# Patient Record
Sex: Male | Born: 1955
Health system: Southern US, Community
[De-identification: ages and names within clinical notes are randomized; demographics above are authoritative.]

## PROBLEM LIST (undated history)

## (undated) DIAGNOSIS — I10 Essential (primary) hypertension: Secondary | ICD-10-CM

## (undated) DIAGNOSIS — Z8619 Personal history of other infectious and parasitic diseases: Secondary | ICD-10-CM

## (undated) DIAGNOSIS — M1990 Unspecified osteoarthritis, unspecified site: Secondary | ICD-10-CM

## (undated) HISTORY — DX: Personal history of other infectious and parasitic diseases: Z86.19

## (undated) HISTORY — DX: Essential (primary) hypertension: I10

---

## 2009-09-18 ENCOUNTER — Ambulatory Visit: Payer: Self-pay | Admitting: Gastroenterology

## 2009-09-21 HISTORY — PX: COLONOSCOPY: SHX174

## 2010-02-01 ENCOUNTER — Emergency Department (HOSPITAL_COMMUNITY): Admission: EM | Admit: 2010-02-01 | Discharge: 2010-02-01 | Payer: Self-pay | Admitting: Family Medicine

## 2013-01-04 ENCOUNTER — Encounter: Payer: Self-pay | Admitting: Family Medicine

## 2013-01-04 ENCOUNTER — Ambulatory Visit (INDEPENDENT_AMBULATORY_CARE_PROVIDER_SITE_OTHER): Payer: BC Managed Care – PPO | Admitting: Family Medicine

## 2013-01-04 VITALS — BP 162/102 | HR 72 | Temp 98.5°F | Ht 71.0 in | Wt 190.5 lb

## 2013-01-04 DIAGNOSIS — M25552 Pain in left hip: Secondary | ICD-10-CM

## 2013-01-04 DIAGNOSIS — M25559 Pain in unspecified hip: Secondary | ICD-10-CM

## 2013-01-04 DIAGNOSIS — M25651 Stiffness of right hip, not elsewhere classified: Secondary | ICD-10-CM | POA: Insufficient documentation

## 2013-01-04 DIAGNOSIS — I10 Essential (primary) hypertension: Secondary | ICD-10-CM

## 2013-01-04 DIAGNOSIS — I1A0 Resistant hypertension: Secondary | ICD-10-CM | POA: Insufficient documentation

## 2013-01-04 DIAGNOSIS — M160 Bilateral primary osteoarthritis of hip: Secondary | ICD-10-CM | POA: Insufficient documentation

## 2013-01-04 MED ORDER — METOPROLOL TARTRATE 25 MG PO TABS: 25.0000 mg | ORAL_TABLET | Freq: Two times a day (BID) | ORAL | Status: DC

## 2013-01-04 NOTE — Progress Notes (Signed)
Subjective:    Patient ID: Roy Galvan, male    DOB: 1956-07-12, 57 y.o.   MRN: [id number]  HPI CC: new pt to establish  Prior saw Dr. Nehemiah Settle.  More convenient here.  HTN - hereditary.  Has been on bp meds for 4 years.  Ale to check bp at home, doesn't really check however.    L hip pain present for years.  Points to lateral hip.  Constantly aches.  On his feel all day.  Has tried aleve prn.  Lives with wife, 2 sons and 1 daughter, dogs and cats (rescue) Occupation: owns [employer] in [place] Edu: HS Activity: no regular exercise 2/2 hip pain Diet: good water, fruits/vegetables some  Preventative: Tetanus 2011 Pneumovax 2012 Colonoscopy 2011 by Dr. Bluford Kaufmann.  Per pt normal, rec rpt 10 yrs.  Medications and allergies reviewed and updated in chart.  Past histories reviewed and updated if relevant as below. There is no problem list on file for this patient.  Past Medical History  Diagnosis Date  . HTN (hypertension)   . History of chicken pox    Past Surgical History  Procedure Laterality Date  . Colonoscopy  2011    normal per pt, rec rpt 10 yrs (Dr. Bluford Kaufmann)   History  Substance Use Topics  . Smoking status: Never Smoker   . Smokeless tobacco: Never Used  . Alcohol Use: No     Comment: no drink since 1995   Family History  Problem Relation Age of Onset  . Hypertension Father   . Hypertension Mother   . Cancer Father     prostate  . Stroke Father     mini  . CAD Neg Hx   . Diabetes Neg Hx    No Known Allergies No current outpatient prescriptions on file prior to visit.   No current facility-administered medications on file prior to visit.     Review of Systems  Constitutional: Negative for fever, chills, activity change, appetite change, fatigue and unexpected weight change.  HENT: Positive for congestion (seasonal). Negative for hearing loss and neck pain.   Eyes: Negative for visual disturbance.  Respiratory: Negative for cough, chest tightness,  shortness of breath and wheezing.   Cardiovascular: Negative for chest pain, palpitations and leg swelling.  Skin: Negative for rash.  Neurological: Negative for dizziness, seizures, syncope and headaches.  Psychiatric/Behavioral: Negative for dysphoric mood. The patient is not nervous/anxious.        Objective:   Physical Exam  Nursing note and vitals reviewed. Constitutional: He is oriented to person, place, and time. He appears well-developed and well-nourished. No distress.  HENT:  Head: Normocephalic and atraumatic.  Nose: Nose normal.  Mouth/Throat: Oropharynx is clear and moist. No oropharyngeal exudate.  Eyes: Conjunctivae and EOM are normal. Pupils are equal, round, and reactive to light. No scleral icterus.  Neck: Normal range of motion. Neck supple. No thyromegaly present.  Cardiovascular: Normal rate, regular rhythm, normal heart sounds and intact distal pulses.   No murmur heard. Pulses:      Radial pulses are 2+ on the right side, and 2+ on the left side.  Pulmonary/Chest: Effort normal and breath sounds normal. No respiratory distress. He has no wheezes. He has no rales.  Abdominal: Soft. Bowel sounds are normal. He exhibits no distension and no mass. There is no tenderness. There is no rebound and no guarding.  Musculoskeletal: Normal range of motion. He exhibits no edema.  No pain with int/ext rotation at  hip bilaterally. Neg FABER test. No pain at GTB bilaterally  Lymphadenopathy:    He has no cervical adenopathy.  Neurological: He is alert and oriented to person, place, and time.  CN grossly intact, station and gait intact  Skin: Skin is warm and dry. No rash noted.  Psychiatric: He has a normal mood and affect. His behavior is normal. Judgment and thought content normal.       Assessment & Plan:

## 2013-01-04 NOTE — Assessment & Plan Note (Signed)
Chronic, uncontrolled. Compliant with 3 antihypertensives. Start 4th today - metoprolol 25mg  bid. recheck at physical. Requested records from prior PCP today.

## 2013-01-04 NOTE — Assessment & Plan Note (Signed)
Unclear etiology. Not consistent with GTBursitis or arthritis. Recommended start stretching exercises, if not better, consider imaging to eval arthritis. Long h/o hockey, softball and other sports.

## 2013-01-04 NOTE — Patient Instructions (Addendum)
Work on diet - less sodium (salt), more water and more fruits/vegetables (potassium [place] foods) to help blood pressure.  Look at [facility] diet below. Goal blood pressure is <140/90.  As your blood pressure is staying elevated, I recommend starting 4th blood pressure medicine, metoprolol 25mg  twice daily.  We will recheck when you return for physical. For hip - start with stretching legs daily.  Exercises provided.  Let me know how this is doing. Return at your convenience fasting for blood work and afterwards for physical.  DASH Diet The DASH diet stands for "Dietary Approaches to Stop Hypertension." It is a healthy eating plan that has been shown to reduce high blood pressure (hypertension) in as little as 14 days, while also possibly providing other significant health benefits. These other health benefits include reducing the risk of breast cancer after menopause and reducing the risk of type 2 diabetes, heart disease, colon cancer, and stroke. Health benefits also include weight loss and slowing kidney failure in patients with chronic kidney disease.  DIET GUIDELINES  Limit salt (sodium). Your diet should contain less than 1500 mg of sodium daily.  Limit refined or processed carbohydrates. Your diet should include mostly whole grains. Desserts and added sugars should be used sparingly.  Include small amounts of heart-healthy fats. These types of fats include nuts, oils, and tub margarine. Limit saturated and trans fats. These fats have been shown to be harmful in the body. CHOOSING FOODS  The following food groups are based on a 2000 calorie diet. See your Registered Dietitian for individual calorie needs. Grains and Grain Products (6 to 8 servings daily)  Eat More Often: Whole-wheat bread, brown rice, whole-grain or wheat pasta, quinoa, popcorn without added fat or salt (air popped).  Eat Less Often: White bread, white pasta, white rice, cornbread. Vegetables (4 to 5 servings daily)  Eat More  Often: Fresh, frozen, and canned vegetables. Vegetables may be raw, steamed, roasted, or grilled with a minimal amount of fat.  Eat Less Often/Avoid: Creamed or fried vegetables. Vegetables in a cheese sauce. Fruit (4 to 5 servings daily)  Eat More Often: All fresh, canned (in natural juice), or frozen fruits. Dried fruits without added sugar. One hundred percent fruit juice ( cup [237 mL] daily).  Eat Less Often: Dried fruits with added sugar. Canned fruit in light or heavy syrup. [employer], Fish, and Poultry (2 servings or less daily. One serving is 3 to 4 oz [85-114 g]).  Eat More Often: Ninety percent or leaner ground beef, tenderloin, sirloin. Round cuts of beef, chicken breast, [place] breast. All fish. Grill, bake, or broil your meat. Nothing should be fried.  Eat Less Often/Avoid: Fatty cuts of meat, [place], or chicken leg, thigh, or wing. Fried cuts of meat or fish. Dairy (2 to 3 servings)  Eat More Often: Low-fat or fat-free milk, low-fat plain or light yogurt, reduced-fat or part-skim cheese.  Eat Less Often/Avoid: Milk (whole, 2%).Whole milk yogurt. Full-fat cheeses. Nuts, Seeds, and Legumes (4 to 5 servings per week)  Eat More Often: All without added salt.  Eat Less Often/Avoid: Salted nuts and seeds, canned beans with added salt. Fats and Sweets (limited)  Eat More Often: Vegetable oils, tub margarines without trans fats, sugar-free gelatin. Mayonnaise and salad dressings.  Eat Less Often/Avoid: Coconut oils, palm oils, butter, stick margarine, cream, half and half, cookies, candy, pie. FOR MORE INFORMATION The Dash Diet Eating Plan: www.dashdiet.org Document Released: 08/27/2011 Document Revised: 11/30/2011 Document Reviewed: 08/27/2011 [employer] Patient Information 2013  [employer].

## 2013-01-26 ENCOUNTER — Other Ambulatory Visit: Payer: Self-pay | Admitting: Family Medicine

## 2013-01-26 DIAGNOSIS — Z125 Encounter for screening for malignant neoplasm of prostate: Secondary | ICD-10-CM

## 2013-01-26 DIAGNOSIS — I10 Essential (primary) hypertension: Secondary | ICD-10-CM

## 2013-01-30 ENCOUNTER — Other Ambulatory Visit (INDEPENDENT_AMBULATORY_CARE_PROVIDER_SITE_OTHER): Payer: BC Managed Care – PPO

## 2013-01-30 DIAGNOSIS — Z125 Encounter for screening for malignant neoplasm of prostate: Secondary | ICD-10-CM

## 2013-01-30 DIAGNOSIS — I10 Essential (primary) hypertension: Secondary | ICD-10-CM

## 2013-01-30 LAB — COMPREHENSIVE METABOLIC PANEL
Alkaline Phosphatase: 51 U/L (ref 39–117)
BUN: 15 mg/dL (ref 6–23)
Creatinine, Ser: 0.7 mg/dL (ref 0.4–1.5)
Glucose, Bld: 104 mg/dL — ABNORMAL HIGH (ref 70–99)
Sodium: 142 mEq/L (ref 135–145)
Total Bilirubin: 0.7 mg/dL (ref 0.3–1.2)

## 2013-01-30 LAB — LIPID PANEL
Cholesterol: 177 mg/dL (ref 0–200)
HDL: 32.4 mg/dL — ABNORMAL LOW (ref >39.00)
Triglycerides: 113 mg/dL (ref 0.0–149.0)
VLDL: 22.6 mg/dL (ref 0.0–40.0)

## 2013-02-06 ENCOUNTER — Ambulatory Visit (INDEPENDENT_AMBULATORY_CARE_PROVIDER_SITE_OTHER): Payer: BC Managed Care – PPO | Admitting: Family Medicine

## 2013-02-06 ENCOUNTER — Encounter: Payer: Self-pay | Admitting: Family Medicine

## 2013-02-06 VITALS — BP 150/90 | HR 60 | Temp 98.2°F | Ht 71.0 in | Wt 185.5 lb

## 2013-02-06 DIAGNOSIS — I10 Essential (primary) hypertension: Secondary | ICD-10-CM

## 2013-02-06 DIAGNOSIS — Z0000 Encounter for general adult medical examination without abnormal findings: Secondary | ICD-10-CM | POA: Insufficient documentation

## 2013-02-06 NOTE — Assessment & Plan Note (Signed)
Preventative protocols reviewed and updated unless pt declined. Discussed healthy diet and lifestyle.  

## 2013-02-06 NOTE — Patient Instructions (Signed)
Good to see you today, call us with questions. Increase aerobic exercise to raise good cholesterol levels. Sign release form today for records of colonoscopy by Dr. Bluford Kaufmann. Return in 6 months to recheck blood pressures.

## 2013-02-06 NOTE — Assessment & Plan Note (Signed)
Chronic, improved control. On 4 bp meds. Consider checking aldosterone levels next blood draw.

## 2013-02-06 NOTE — Progress Notes (Signed)
Subjective:    Patient ID: Roy Galvan, male    DOB: [birth year], 57 y.o.   MRN: [id number]  HPI CC: CPE  Hypertension - on 4 meds, still poor control of BP.  State at home running 130s/88-90.  Today still elevated but better.  Occasional dizziness.  No chest pain/tightness, HA, SOB, leg swelling.  Cutting down on salt.  Drinking good water.  Denies pheo sxs. BP Readings from Last 3 Encounters:  02/06/13 150/90  01/04/13 162/102    Preventative:  Colonoscopy 2011 by Dr. Bluford Kaufmann. Per pt normal, rec rpt 10 yrs. Prostate cancer screening - father with h/o prostate cancer. Tetanus 2011  Pneumovax 2012 ?unsure Flu shot - 2011  Seat belt use discussed Sunscreen use discussed - doesn't wear.  Denies concerning skin spots.  Lives with wife, 2 sons and 1 daughter, dogs and cats (rescue)  Occupation: owns [employer] in [place]  Edu: HS  Activity: no regular exercise 2/2 hip pain  Diet: good water, fruits/vegetables daily   Medications and allergies reviewed and updated in chart.  Past histories reviewed and updated if relevant as below. Patient Active Problem List   Diagnosis Date Noted  . Left hip pain 01/04/2013  . HTN (hypertension)    Past Medical History  Diagnosis Date  . HTN (hypertension)   . History of chicken pox    Past Surgical History  Procedure Laterality Date  . Colonoscopy  2011    normal per pt, rec rpt 10 yrs (Dr. Bluford Kaufmann)   History  Substance Use Topics  . Smoking status: Never Smoker   . Smokeless tobacco: Never Used  . Alcohol Use: No     Comment: no drink since 1995   Family History  Problem Relation Age of Onset  . Hypertension Father   . Hypertension Mother   . Cancer Father     prostate  . Stroke Father     mini  . CAD Neg Hx   . Diabetes Neg Hx    No Known Allergies Current Outpatient Prescriptions on File Prior to Visit  Medication Sig Dispense Refill  . amLODipine (NORVASC) 10 MG tablet Take 10 mg by mouth daily.      Marland Kitchen aspirin  81 MG tablet Take 81 mg by mouth daily.      . enalapril (VASOTEC) 20 MG tablet Take 20 mg by mouth 2 (two) times daily.      . hydrochlorothiazide (HYDRODIURIL) 25 MG tablet Take 25 mg by mouth daily.      . metoprolol tartrate (LOPRESSOR) 25 MG tablet Take 1 tablet (25 mg total) by mouth 2 (two) times daily.  60 tablet  11   No current facility-administered medications on file prior to visit.     Review of Systems  Constitutional: Negative for fever, chills, activity change, appetite change, fatigue and unexpected weight change.  HENT: Negative for hearing loss, congestion (seasonal) and neck pain.   Eyes: Negative for visual disturbance.  Respiratory: Negative for cough, chest tightness, shortness of breath and wheezing.   Cardiovascular: Negative for chest pain, palpitations and leg swelling.  Gastrointestinal: Negative for nausea, vomiting, abdominal pain, diarrhea, constipation, blood in stool and abdominal distention.  Genitourinary: Negative for hematuria and difficulty urinating.  Musculoskeletal: Negative for myalgias and arthralgias.  Skin: Negative for rash.  Neurological: Negative for dizziness, seizures, syncope and headaches.  Hematological: Negative for adenopathy. Does not bruise/bleed easily.  Psychiatric/Behavioral: Negative for dysphoric mood. The patient is not nervous/anxious.  Objective:   Physical Exam  Nursing note and vitals reviewed. Constitutional: He is oriented to person, place, and time. He appears well-developed and well-nourished. No distress.  HENT:  Head: Normocephalic and atraumatic.  Nose: Nose normal.  Mouth/Throat: Oropharynx is clear and moist. No oropharyngeal exudate.  Eyes: Conjunctivae and EOM are normal. Pupils are equal, round, and reactive to light. No scleral icterus.  Neck: Normal range of motion. Neck supple. Carotid bruit is not present. No thyromegaly present.  Cardiovascular: Normal rate, regular rhythm, normal heart sounds  and intact distal pulses.   No murmur heard. Pulses:      Radial pulses are 2+ on the right side, and 2+ on the left side.  Pulmonary/Chest: Effort normal and breath sounds normal. No respiratory distress. He has no wheezes. He has no rales.  Abdominal: Soft. Normal appearance and bowel sounds are normal. He exhibits no distension, no abdominal bruit and no mass. There is no tenderness. There is no rebound and no guarding.  No abd/renal bruit  Genitourinary: Rectum normal and prostate normal. Rectal exam shows no external hemorrhoid, no internal hemorrhoid, no fissure, no mass, no tenderness and anal tone normal. Prostate is not enlarged (15gm) and not tender.  Musculoskeletal: Normal range of motion. He exhibits no edema.  Lymphadenopathy:    He has no cervical adenopathy.  Neurological: He is alert and oriented to person, place, and time.  CN grossly intact, station and gait intact  Skin: Skin is warm and dry. No rash noted.  Psychiatric: He has a normal mood and affect. His behavior is normal. Judgment and thought content normal.       Assessment & Plan:

## 2013-02-13 ENCOUNTER — Encounter: Payer: Self-pay | Admitting: Family Medicine

## 2013-02-17 ENCOUNTER — Encounter: Payer: Self-pay | Admitting: Family Medicine

## 2013-07-31 ENCOUNTER — Telehealth: Payer: Self-pay

## 2013-07-31 NOTE — Telephone Encounter (Signed)
Pt request refills of meds but does not know the name of meds needed. Pt will cb with names of meds that need refill.

## 2013-08-03 MED ORDER — AMLODIPINE BESYLATE 10 MG PO TABS: 10.0000 mg | ORAL_TABLET | Freq: Every day | ORAL | Status: DC

## 2013-08-03 MED ORDER — ENALAPRIL MALEATE 20 MG PO TABS: 20.0000 mg | ORAL_TABLET | Freq: Two times a day (BID) | ORAL | Status: DC

## 2013-08-03 MED ORDER — HYDROCHLOROTHIAZIDE 25 MG PO TABS: 25.0000 mg | ORAL_TABLET | Freq: Every day | ORAL | Status: DC

## 2013-08-03 NOTE — Telephone Encounter (Signed)
Pt called back requesting refills amlodipine,enalapril and HCTZ to [facility] [address]. Verified dosage and instructions with pt.Advised will do one month; pt has 08/14/13 appt and will discuss BP med at that appt. Pt voiced understanding.

## 2013-08-03 NOTE — Addendum Note (Signed)
Addended by: Patience Musca on: 08/03/2013 10:37 AM   Modules accepted: Orders

## 2013-08-14 ENCOUNTER — Encounter: Payer: Self-pay | Admitting: Family Medicine

## 2013-08-14 ENCOUNTER — Ambulatory Visit (INDEPENDENT_AMBULATORY_CARE_PROVIDER_SITE_OTHER): Payer: BC Managed Care – PPO | Admitting: Family Medicine

## 2013-08-14 VITALS — BP 142/96 | HR 68 | Temp 98.2°F | Wt 186.5 lb

## 2013-08-14 DIAGNOSIS — Z23 Encounter for immunization: Secondary | ICD-10-CM

## 2013-08-14 DIAGNOSIS — I10 Essential (primary) hypertension: Secondary | ICD-10-CM

## 2013-08-14 LAB — BASIC METABOLIC PANEL
CO2: 30 mEq/L (ref 19–32)
Chloride: 102 mEq/L (ref 96–112)
Creatinine, Ser: 0.9 mg/dL (ref 0.4–1.5)
Potassium: 4.2 mEq/L (ref 3.5–5.1)
Sodium: 140 mEq/L (ref 135–145)

## 2013-08-14 MED ORDER — AMLODIPINE BESYLATE 10 MG PO TABS: 10.0000 mg | ORAL_TABLET | Freq: Every day | ORAL | Status: DC

## 2013-08-14 MED ORDER — ENALAPRIL MALEATE 20 MG PO TABS: 20.0000 mg | ORAL_TABLET | Freq: Two times a day (BID) | ORAL | Status: DC

## 2013-08-14 MED ORDER — TRIAMTERENE-HCTZ 37.5-25 MG PO TABS: 1.0000 | ORAL_TABLET | Freq: Every day | ORAL | Status: DC

## 2013-08-14 MED ORDER — METOPROLOL TARTRATE 25 MG PO TABS: 25.0000 mg | ORAL_TABLET | Freq: Two times a day (BID) | ORAL | Status: DC

## 2013-08-14 NOTE — Progress Notes (Signed)
  Subjective:    Patient ID: Roy Galvan, male    DOB: 26-Aug-1956, 57 y.o.   MRN: [id number]  HPI CC: 6 mo f/u  Several months ago had cold sxs. Recently joined [facility].  Has not had a chance to go.  HTN - on 4 meds for last 4-5 years.  No HA, vision changes, CP/tightness, SOB, leg swelling.  Endorses good vegetable intake.  Flu shot today  Past Medical History  Diagnosis Date  . HTN (hypertension)   . History of chicken pox      Review of Systems Per HPI    Objective:   Physical Exam  Nursing note and vitals reviewed. Constitutional: He appears well-developed and well-nourished. No distress.  Cardiovascular: Normal rate, regular rhythm, normal heart sounds and intact distal pulses.   No murmur heard. Pulmonary/Chest: Effort normal and breath sounds normal. No respiratory distress. He has no wheezes. He has no rales.  Musculoskeletal: He exhibits no edema.       Assessment & Plan:

## 2013-08-14 NOTE — Addendum Note (Signed)
Addended by: Josph Macho A on: 08/14/2013 04:28 PM   Modules accepted: Orders

## 2013-08-14 NOTE — Progress Notes (Signed)
Pre-visit discussion using our clinic review tool. No additional management support is needed unless otherwise documented below in the visit note.  

## 2013-08-14 NOTE — Patient Instructions (Signed)
Flu shot today. Blood work today. Start maxzide (hctz and triamterene) instead of plain hctz (hydrochlorothiazide).  New medicine sent to pharmacy Good to see you today, call us with questions. Return in 6 months for physical or as needed.

## 2013-08-14 NOTE — Assessment & Plan Note (Signed)
Chronic, elevated but stable. Difficult to control despite 4 meds maxed - check aldosterone levels today to eval for hyperaldosteronism. Start maxzide instead of hctz. Pt agrees with plan.

## 2013-08-21 LAB — ALDOSTERONE + RENIN ACTIVITY W/ RATIO
ALDO / PRA Ratio: 26.7 Ratio (ref 0.9–28.9)
PRA LC/MS/MS: 0.3 ng/mL/h (ref 0.25–5.82)

## 2013-08-22 ENCOUNTER — Encounter: Payer: Self-pay | Admitting: Family Medicine

## 2014-02-14 ENCOUNTER — Other Ambulatory Visit (INDEPENDENT_AMBULATORY_CARE_PROVIDER_SITE_OTHER): Payer: BC Managed Care – PPO

## 2014-02-14 ENCOUNTER — Telehealth: Payer: Self-pay | Admitting: Family Medicine

## 2014-02-14 ENCOUNTER — Other Ambulatory Visit: Payer: Self-pay | Admitting: Family Medicine

## 2014-02-14 DIAGNOSIS — I10 Essential (primary) hypertension: Secondary | ICD-10-CM

## 2014-02-14 DIAGNOSIS — Z125 Encounter for screening for malignant neoplasm of prostate: Secondary | ICD-10-CM

## 2014-02-14 LAB — BASIC METABOLIC PANEL
BUN: 12 mg/dL (ref 6–23)
CO2: 29 meq/L (ref 19–32)
Calcium: 9.1 mg/dL (ref 8.4–10.5)
Chloride: 104 mEq/L (ref 96–112)
Creatinine, Ser: 0.9 mg/dL (ref 0.4–1.5)
GFR: 89.27 mL/min (ref >60.00)
GLUCOSE: 85 mg/dL (ref 70–99)
Potassium: 4.1 mEq/L (ref 3.5–5.1)
SODIUM: 142 meq/L (ref 135–145)

## 2014-02-14 LAB — PSA: PSA: 0.52 ng/mL (ref 0.10–4.00)

## 2014-02-14 LAB — LIPID PANEL
Cholesterol: 178 mg/dL (ref 0–200)
HDL: 34.8 mg/dL — ABNORMAL LOW (ref >39.00)
LDL Cholesterol: 123 mg/dL — ABNORMAL HIGH (ref 0–99)
Total CHOL/HDL Ratio: 5
Triglycerides: 103 mg/dL (ref 0.0–149.0)
VLDL: 20.6 mg/dL (ref 0.0–40.0)

## 2014-02-14 LAB — TSH: TSH: 1.46 u[IU]/mL (ref 0.35–4.50)

## 2014-02-14 NOTE — Telephone Encounter (Signed)
Relevant patient education assigned to patient using Emmi. ° °

## 2014-02-19 ENCOUNTER — Ambulatory Visit (INDEPENDENT_AMBULATORY_CARE_PROVIDER_SITE_OTHER): Payer: BC Managed Care – PPO | Admitting: Family Medicine

## 2014-02-19 ENCOUNTER — Encounter: Payer: BC Managed Care – PPO | Admitting: Family Medicine

## 2014-02-19 ENCOUNTER — Encounter: Payer: Self-pay | Admitting: Family Medicine

## 2014-02-19 VITALS — BP 130/80 | HR 63 | Temp 98.1°F | Ht 71.0 in | Wt 193.0 lb

## 2014-02-19 DIAGNOSIS — I10 Essential (primary) hypertension: Secondary | ICD-10-CM

## 2014-02-19 DIAGNOSIS — M25559 Pain in unspecified hip: Secondary | ICD-10-CM

## 2014-02-19 DIAGNOSIS — M25552 Pain in left hip: Secondary | ICD-10-CM

## 2014-02-19 DIAGNOSIS — Z0000 Encounter for general adult medical examination without abnormal findings: Secondary | ICD-10-CM

## 2014-02-19 NOTE — Patient Instructions (Signed)
Try to increase activity a little daily. Start lower extremity stretches. Work on small amount of weight loss.  This should all help hoips. If no improvement next visit, we will check hip xrays. Look into glucosamine supplement (osteo bi flex) for joint health. Also start vitamin D supplement 1000-2000 units daily. Good to see you today, call us with questions.

## 2014-02-19 NOTE — Assessment & Plan Note (Signed)
Chronic, stable. Continue 5 med regimen. Aldosterone normal (07/2013)

## 2014-02-19 NOTE — Progress Notes (Signed)
BP 130/80  Pulse 63  Temp(Src) 98.1 F (36.7 C) (Oral)  Ht 5\' 11"  (1.803 m)  Wt 193 lb (87.544 kg)  BMI 26.93 kg/m2  SpO2 96%   CC: CPE  Subjective:    Patient ID: Roy Galvan, male    DOB: 01-07-1956, 58 y.o.   MRN: [id number]  HPI: Roy Galvan is a 58 y.o. male presenting on 02/19/2014 for Annual Exam   HTN - tolerating change to maxzide. Hip pain - L>R. Ongoing over 1 year. Points to lateral hips as site of pain with no radiation. Better when he rests hip. Worse with activity. Treated with occasional OTCs.  Preventative: COLONOSCOPY Date: 2011 WNL, rec rpt 10 yrs (Dr. Bluford Kaufmannh) Prostate cancer screening - father with h/o prostate cancer. Desires yearly screen. Tetanus 2011  Pneumovax 2012 ?unsure  Flu shot - 07/2013 Seat belt use 100% Sunscreen use discussed - on ears and neck. Denies concerning skin spots.   Lives with wife, 2 sons and 1 daughter, dogs and cats (rescue)  Occupation: owns [employer] in [place]  Edu: HS  Activity: no regular exercise 2/2 hip pain  Diet: good water, fruits/vegetables daily  Wt Readings from Last 3 Encounters:  02/19/14 193 lb (87.544 kg)  08/14/13 186 lb 8 oz (84.596 kg)  02/06/13 185 lb 8 oz (84.142 kg)   Body mass index is 26.93 kg/(m^2).  Relevant past medical, surgical, family and social history reviewed and updated as indicated.  Allergies and medications reviewed and updated. Current Outpatient Prescriptions on File Prior to Visit  Medication Sig  . amLODipine (NORVASC) 10 MG tablet Take 1 tablet (10 mg total) by mouth daily.  Marland Kitchen. aspirin 81 MG tablet Take 81 mg by mouth daily.  . enalapril (VASOTEC) 20 MG tablet Take 1 tablet (20 mg total) by mouth 2 (two) times daily.  . metoprolol tartrate (LOPRESSOR) 25 MG tablet Take 1 tablet (25 mg total) by mouth 2 (two) times daily.  Marland Kitchen. triamterene-hydrochlorothiazide (MAXZIDE-25) 37.5-25 MG per tablet Take 1 tablet by mouth daily.   No current facility-administered medications  on file prior to visit.    Review of Systems  Constitutional: Negative for fever, chills, activity change, appetite change, fatigue and unexpected weight change.  HENT: Negative for hearing loss.   Eyes: Negative for visual disturbance.  Respiratory: Negative for cough, chest tightness, shortness of breath and wheezing.   Cardiovascular: Negative for chest pain, palpitations and leg swelling.  Gastrointestinal: Negative for nausea, vomiting, abdominal pain, diarrhea, constipation, blood in stool and abdominal distention.  Genitourinary: Negative for hematuria and difficulty urinating.  Musculoskeletal: Negative for arthralgias, myalgias and neck pain.  Skin: Negative for rash.  Neurological: Negative for dizziness, seizures, syncope and headaches.  Hematological: Negative for adenopathy. Does not bruise/bleed easily.  Psychiatric/Behavioral: Negative for dysphoric mood. The patient is not nervous/anxious.    Per HPI unless specifically indicated above    Objective:    BP 130/80  Pulse 63  Temp(Src) 98.1 F (36.7 C) (Oral)  Ht 5\' 11"  (1.803 m)  Wt 193 lb (87.544 kg)  BMI 26.93 kg/m2  SpO2 96%  Physical Exam  Nursing note and vitals reviewed. Constitutional: He is oriented to person, place, and time. He appears well-developed and well-nourished. No distress.  HENT:  Head: Normocephalic and atraumatic.  Right Ear: Hearing, tympanic membrane, external ear and ear canal normal.  Left Ear: Hearing, tympanic membrane, external ear and ear canal normal.  Nose: Nose normal.  Mouth/Throat: Uvula  is midline, oropharynx is clear and moist and mucous membranes are normal. No oropharyngeal exudate, posterior oropharyngeal edema or posterior oropharyngeal erythema.  Eyes: Conjunctivae and EOM are normal. Pupils are equal, round, and reactive to light. No scleral icterus.  Neck: Normal range of motion. Neck supple. Carotid bruit is not present. No thyromegaly present.  Cardiovascular: Normal  rate, regular rhythm, normal heart sounds and intact distal pulses.   No murmur heard. Pulses:      Radial pulses are 2+ on the right side, and 2+ on the left side.  Pulmonary/Chest: Effort normal and breath sounds normal. No respiratory distress. He has no wheezes. He has no rales.  Abdominal: Soft. Bowel sounds are normal. He exhibits no distension and no mass. There is no tenderness. There is no rebound and no guarding.  Musculoskeletal: Normal range of motion. He exhibits no edema.  No significant pain with int/ext rotation at hip but very stiff. Neg FABER. No pain at SIJ, GTB or sciatic notch bilaterally.  Lymphadenopathy:    He has no cervical adenopathy.  Neurological: He is alert and oriented to person, place, and time.  CN grossly intact, station and gait intact  Skin: Skin is warm and dry. No rash noted.  Psychiatric: He has a normal mood and affect. His behavior is normal. Judgment and thought content normal.   Results for orders placed in visit on 02/14/14  LIPID PANEL      Result Value Ref Range   Cholesterol 178  0 - 200 mg/dL   Triglycerides 765.4  0.0 - 149.0 mg/dL   HDL 65.03 (*) >54.65 mg/dL   VLDL 68.1  0.0 - 27.5 mg/dL   LDL Cholesterol 170 (*) 0 - 99 mg/dL   Total CHOL/HDL Ratio 5    BASIC METABOLIC PANEL      Result Value Ref Range   Sodium 142  135 - 145 mEq/L   Potassium 4.1  3.5 - 5.1 mEq/L   Chloride 104  96 - 112 mEq/L   CO2 29  19 - 32 mEq/L   Glucose, Bld 85  70 - 99 mg/dL   BUN 12  6 - 23 mg/dL   Creatinine, Ser 0.9  0.4 - 1.5 mg/dL   Calcium 9.1  8.4 - 01.7 mg/dL   GFR 49.44  >96.75 mL/min  TSH      Result Value Ref Range   TSH 1.46  0.35 - 4.50 uIU/mL  PSA      Result Value Ref Range   PSA 0.52  0.10 - 4.00 ng/mL      Assessment & Plan:   Problem List Items Addressed This Visit   Left hip pain     ?pain due to arthritis of hips.  rec start osteo bi flex and vit D 1000-200 units regularly for next 4-6 months and f/u after. If persistent  pain, check hip xrays. Long h/o hockey, softball and other sports.    HTN (hypertension)     Chronic, stable. Continue 5 med regimen. Aldosterone normal (07/2013)    Healthcare maintenance - Primary     Preventative protocols reviewed and updated unless pt declined. Discussed healthy diet and lifestyle.        Follow up plan: Return in about 6 months (around 08/21/2014), or as needed, for follow up hip and blood pressure.

## 2014-02-19 NOTE — Assessment & Plan Note (Signed)
Preventative protocols reviewed and updated unless pt declined. Discussed healthy diet and lifestyle.  

## 2014-02-19 NOTE — Assessment & Plan Note (Signed)
?  pain due to arthritis of hips.  rec start osteo bi flex and vit D 1000-200 units regularly for next 4-6 months and f/u after. If persistent pain, check hip xrays. Long h/o hockey, softball and other sports.

## 2014-02-19 NOTE — Progress Notes (Signed)
Pre-visit discussion using our clinic review tool. No additional management support is needed unless otherwise documented below in the visit note.  

## 2014-03-14 ENCOUNTER — Encounter: Payer: BC Managed Care – PPO | Admitting: Family Medicine

## 2014-08-20 ENCOUNTER — Ambulatory Visit: Payer: BC Managed Care – PPO | Admitting: Family Medicine

## 2014-08-21 ENCOUNTER — Ambulatory Visit: Payer: BC Managed Care – PPO | Admitting: Family Medicine

## 2014-08-22 ENCOUNTER — Ambulatory Visit (INDEPENDENT_AMBULATORY_CARE_PROVIDER_SITE_OTHER): Payer: BC Managed Care – PPO | Admitting: Family Medicine

## 2014-08-22 ENCOUNTER — Encounter: Payer: Self-pay | Admitting: Family Medicine

## 2014-08-22 VITALS — BP 132/78 | HR 64 | Temp 98.3°F | Wt 199.5 lb

## 2014-08-22 DIAGNOSIS — M25552 Pain in left hip: Secondary | ICD-10-CM

## 2014-08-22 DIAGNOSIS — M25551 Pain in right hip: Secondary | ICD-10-CM

## 2014-08-22 NOTE — Progress Notes (Signed)
   BP 132/78 mmHg  Pulse 64  Temp(Src) 98.3 F (36.8 C) (Oral)  Wt 199 lb 8 oz (90.493 kg)   CC: f/u hip pain  Subjective:    Patient ID: Roy Galvan, male    DOB: 1955/10/16, 58 y.o.   MRN: [id number]  HPI: Roy Galvan is a 58 y.o. male presenting on 08/22/2014 for Follow-up   Hip tightness - L>R. Ongoing over 1.5 year. Increased tightness and decreased flexibility noted. Improved with aleve and rest (hasn't taken NSAID in past week). Worse with activity - ie recent [place] significant walking and really felt this in hips. Points to lateral and posterior hip as site of intermittent dull ache pain with no radiation.   At last visit 02/2014 - ?pain due to arthritis of hips. rec start osteo bi flex and vit D 1000-200 units regularly for next 4-6 months and f/u after. Long h/o hockey, softball and other sports.  Did not try vit D or osteo biflex.  Pt not currently interested in surgery.   No other joint pains. No new rashes, no fevers/chills.  Relevant past medical, surgical, family and social history reviewed and updated as indicated. Interim medical history since our last visit reviewed. Allergies and medications reviewed and updated.  Current Outpatient Prescriptions on File Prior to Visit  Medication Sig  . amLODipine (NORVASC) 10 MG tablet Take 1 tablet (10 mg total) by mouth daily.  Marland Kitchen. aspirin 81 MG tablet Take 81 mg by mouth daily.  . enalapril (VASOTEC) 20 MG tablet Take 1 tablet (20 mg total) by mouth 2 (two) times daily.  . metoprolol tartrate (LOPRESSOR) 25 MG tablet Take 1 tablet (25 mg total) by mouth 2 (two) times daily.  Marland Kitchen. triamterene-hydrochlorothiazide (MAXZIDE-25) 37.5-25 MG per tablet Take 1 tablet by mouth daily.   No current facility-administered medications on file prior to visit.    Review of Systems Per HPI unless specifically indicated above     Objective:    BP 132/78 mmHg  Pulse 64  Temp(Src) 98.3 F (36.8 C) (Oral)  Wt 199 lb 8 oz  (90.493 kg)  Wt Readings from Last 3 Encounters:  08/22/14 199 lb 8 oz (90.493 kg)  02/19/14 193 lb (87.544 kg)  08/14/13 186 lb 8 oz (84.596 kg)    Physical Exam  Constitutional: He appears well-developed and well-nourished. No distress.  Musculoskeletal: He exhibits no edema.  No pain midline spine No paraspinous mm tenderness Neg SLR bilaterally. No pain with int/ext rotation at hip but tightness at lateral hips with bilateral int rotation Tightness throughout Neg FABER. No pain at SIJ, GTB or sciatic notch bilaterally.   Nursing note and vitals reviewed.      Assessment & Plan:   Problem List Items Addressed This Visit    Bilateral hip pain - Primary    Actually less consistent with arthritis today than with generalized hip flexor and lateral muscle tightness/spasm. Treat with stretching exercises from [facility] pt advisor. Discussed hip xrays -pt declines today. Will return if not better with exercises.        Follow up plan: Return if symptoms worsen or fail to improve.

## 2014-08-22 NOTE — Assessment & Plan Note (Signed)
Actually less consistent with arthritis today than with generalized hip flexor and lateral muscle tightness/spasm. Treat with stretching exercises from [facility] pt advisor. Discussed hip xrays -pt declines today. Will return if not better with exercises.

## 2014-08-22 NOTE — Progress Notes (Signed)
Pre visit review using our clinic review tool, if applicable. No additional management support is needed unless otherwise documented below in the visit note. 

## 2014-08-22 NOTE — Patient Instructions (Addendum)
Actually I don't think this is arthritis but rather tight muscles of hips/thighs. Treat with stretching exercises provided today. May continue aleve as needed. If not improving , let us know to return for xrays of hips to rule out arthritis.

## 2014-08-26 ENCOUNTER — Other Ambulatory Visit: Payer: Self-pay | Admitting: Family Medicine

## 2014-12-25 ENCOUNTER — Other Ambulatory Visit: Payer: Self-pay | Admitting: Family Medicine

## 2014-12-27 ENCOUNTER — Other Ambulatory Visit: Payer: Self-pay | Admitting: Family Medicine

## 2015-01-15 ENCOUNTER — Other Ambulatory Visit: Payer: Self-pay | Admitting: Family Medicine

## 2015-02-09 ENCOUNTER — Other Ambulatory Visit: Payer: Self-pay | Admitting: Family Medicine

## 2015-04-20 ENCOUNTER — Other Ambulatory Visit: Payer: Self-pay | Admitting: Family Medicine

## 2015-04-22 NOTE — Telephone Encounter (Signed)
Received refill request electronically Last office visit 08/22/14 Last refill note was sent to patient to scheduled office visit, no upcoming appointment scheduled Is it okay to refill medication?

## 2015-05-31 ENCOUNTER — Other Ambulatory Visit: Payer: Self-pay

## 2015-05-31 MED ORDER — METOPROLOL TARTRATE 25 MG PO TABS: ORAL_TABLET | ORAL | Status: DC

## 2015-05-31 MED ORDER — AMLODIPINE BESYLATE 10 MG PO TABS: ORAL_TABLET | ORAL | Status: DC

## 2015-05-31 MED ORDER — TRIAMTERENE-HCTZ 37.5-25 MG PO TABS: 1.0000 | ORAL_TABLET | Freq: Every day | ORAL | Status: DC

## 2015-05-31 NOTE — Telephone Encounter (Signed)
Pt request refill amlodipine, metoprolol tartrate and triamterene -HCTZ to [facility]; last annual 02/2014 and no future appt scheduled. Advised pt can refill # 30 x 0 and pt will cb to schedule appt.

## 2015-07-08 ENCOUNTER — Other Ambulatory Visit: Payer: Self-pay | Admitting: Family Medicine

## 2015-08-03 ENCOUNTER — Other Ambulatory Visit: Payer: Self-pay | Admitting: Family Medicine

## 2015-09-01 ENCOUNTER — Other Ambulatory Visit: Payer: Self-pay | Admitting: Family Medicine

## 2015-09-30 ENCOUNTER — Other Ambulatory Visit: Payer: Self-pay | Admitting: Family Medicine

## 2015-10-02 ENCOUNTER — Telehealth: Payer: Self-pay | Admitting: Family Medicine

## 2015-10-02 ENCOUNTER — Other Ambulatory Visit (INDEPENDENT_AMBULATORY_CARE_PROVIDER_SITE_OTHER): Payer: BLUE CROSS/BLUE SHIELD

## 2015-10-02 ENCOUNTER — Other Ambulatory Visit: Payer: Self-pay | Admitting: Family Medicine

## 2015-10-02 DIAGNOSIS — Z125 Encounter for screening for malignant neoplasm of prostate: Secondary | ICD-10-CM

## 2015-10-02 DIAGNOSIS — I10 Essential (primary) hypertension: Secondary | ICD-10-CM

## 2015-10-02 LAB — BASIC METABOLIC PANEL
BUN: 16 mg/dL (ref 6–23)
CALCIUM: 9.2 mg/dL (ref 8.4–10.5)
CO2: 33 mEq/L — ABNORMAL HIGH (ref 19–32)
Chloride: 102 mEq/L (ref 96–112)
Creatinine, Ser: 0.84 mg/dL (ref 0.40–1.50)
GFR: 99.81 mL/min (ref >60.00)
GLUCOSE: 97 mg/dL (ref 70–99)
Potassium: 3.9 mEq/L (ref 3.5–5.1)
SODIUM: 143 meq/L (ref 135–145)

## 2015-10-02 LAB — LIPID PANEL
CHOL/HDL RATIO: 8
CHOLESTEROL: 201 mg/dL — AB (ref 0–200)
HDL: 26.8 mg/dL — ABNORMAL LOW (ref >39.00)

## 2015-10-02 LAB — LDL CHOLESTEROL, DIRECT: Direct LDL: 70 mg/dL

## 2015-10-02 LAB — PSA: PSA: 0.65 ng/mL (ref 0.10–4.00)

## 2015-10-02 MED ORDER — ENALAPRIL MALEATE 20 MG PO TABS: 20.0000 mg | ORAL_TABLET | Freq: Two times a day (BID) | ORAL | Status: DC

## 2015-10-02 NOTE — Telephone Encounter (Signed)
Pt had labs today and needs ot get a refill on enalapril [facility]

## 2015-10-02 NOTE — Telephone Encounter (Signed)
Patient is past due for follow up.  CPE scheduled 10/09/15.  Refill sent x1 month.

## 2015-10-09 ENCOUNTER — Encounter: Payer: Self-pay | Admitting: Family Medicine

## 2015-10-09 ENCOUNTER — Ambulatory Visit (INDEPENDENT_AMBULATORY_CARE_PROVIDER_SITE_OTHER): Payer: BLUE CROSS/BLUE SHIELD | Admitting: Family Medicine

## 2015-10-09 VITALS — BP 128/84 | HR 60 | Temp 97.8°F | Wt 184.5 lb

## 2015-10-09 DIAGNOSIS — I10 Essential (primary) hypertension: Secondary | ICD-10-CM

## 2015-10-09 DIAGNOSIS — M25552 Pain in left hip: Secondary | ICD-10-CM | POA: Diagnosis not present

## 2015-10-09 DIAGNOSIS — Z0000 Encounter for general adult medical examination without abnormal findings: Secondary | ICD-10-CM | POA: Diagnosis not present

## 2015-10-09 DIAGNOSIS — E781 Pure hyperglyceridemia: Secondary | ICD-10-CM

## 2015-10-09 DIAGNOSIS — Z23 Encounter for immunization: Secondary | ICD-10-CM

## 2015-10-09 DIAGNOSIS — E785 Hyperlipidemia, unspecified: Secondary | ICD-10-CM | POA: Insufficient documentation

## 2015-10-09 MED ORDER — AMLODIPINE BESYLATE 10 MG PO TABS: ORAL_TABLET | ORAL | Status: DC

## 2015-10-09 MED ORDER — METOPROLOL TARTRATE 25 MG PO TABS: ORAL_TABLET | ORAL | Status: DC

## 2015-10-09 MED ORDER — ENALAPRIL MALEATE 20 MG PO TABS: 20.0000 mg | ORAL_TABLET | Freq: Two times a day (BID) | ORAL | Status: DC

## 2015-10-09 MED ORDER — TRIAMTERENE-HCTZ 37.5-25 MG PO TABS: ORAL_TABLET | ORAL | Status: DC

## 2015-10-09 NOTE — Assessment & Plan Note (Signed)
Severe today - first time. Declines significant greasy food intake. Will return tomorrow am to recheck trig.

## 2015-10-09 NOTE — Progress Notes (Signed)
BP 128/84 mmHg  Pulse 60  Temp(Src) 97.8 F (36.6 C) (Oral)  Wt 184 lb 8 oz (83.689 kg)   CC: CPE  Subjective:    Patient ID: Roy Galvan, male    DOB: 1956/01/29, 60 y.o.   MRN: [id number]  HPI: Roy Galvan is a 60 y.o. male presenting on 10/09/2015 for Annual Exam   15lb weight loss - diet, cut out sodas. Wanted to lose weight for better blood pressure control.  Chronic left hip pain. Points to lateral hip. Previously thought due to generalized hip flexor/lateral muscle tightness. Did not do exercises provided last visit.   Preventative: COLONOSCOPY Date: 2011 WNL, rec rpt 10 yrs (Dr. Bluford Kaufmann) Prostate cancer screening - father with h/o prostate cancer. Desires yearly screen. Flu shot - yearly Tetanus 2011  Pneumovax unsure Seat belt use 100% Sunscreen use discussed. 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 Interim medical history since our last visit reviewed. Allergies and medications reviewed and updated. Current Outpatient Prescriptions on File Prior to Visit  Medication Sig  . aspirin 81 MG tablet Take 81 mg by mouth daily.   No current facility-administered medications on file prior to visit.    Review of Systems  Constitutional: Negative for fever, chills, activity change, appetite change, fatigue and unexpected weight change.  HENT: Negative for hearing loss.   Eyes: Negative for visual disturbance.  Respiratory: Negative for cough, chest tightness, shortness of breath and wheezing.   Cardiovascular: Negative for chest pain, palpitations and leg swelling.  Gastrointestinal: Negative for nausea, vomiting, abdominal pain, diarrhea, constipation, blood in stool and  abdominal distention.  Genitourinary: Negative for hematuria and difficulty urinating.  Musculoskeletal: Negative for myalgias, arthralgias and neck pain.  Skin: Negative for rash.  Neurological: Negative for dizziness, seizures, syncope and headaches.  Hematological: Negative for adenopathy. Does not bruise/bleed easily.  Psychiatric/Behavioral: Negative for dysphoric mood. The patient is not nervous/anxious.    Per HPI unless specifically indicated in ROS section     Objective:    BP 128/84 mmHg  Pulse 60  Temp(Src) 97.8 F (36.6 C) (Oral)  Wt 184 lb 8 oz (83.689 kg)  Wt Readings from Last 3 Encounters:  10/09/15 184 lb 8 oz (83.689 kg)  08/22/14 199 lb 8 oz (90.493 kg)  02/19/14 193 lb (87.544 kg)   Body mass index is 25.74 kg/(m^2).  Physical Exam  Constitutional: He is oriented to person, place, and time. He appears well-developed and well-nourished. No distress.  HENT:  Head: Normocephalic and atraumatic.  Right Ear: Hearing, tympanic membrane, external ear and ear canal normal.  Left Ear: Hearing, tympanic membrane, external ear and ear canal normal.  Nose: Nose normal.  Mouth/Throat: Uvula is midline, oropharynx is clear and moist and mucous membranes are normal. No oropharyngeal exudate, posterior oropharyngeal edema or posterior oropharyngeal erythema.  Eyes: Conjunctivae and EOM are normal. Pupils are equal, round, and reactive to light. No scleral icterus.  Neck: Normal range of motion. Neck supple. No thyromegaly present.  Cardiovascular: Normal rate, regular rhythm, normal heart sounds and intact distal pulses.   No murmur heard. Pulses:      Radial pulses are 2+ on the right side, and 2+  on the left side.  Pulmonary/Chest: Effort normal and breath sounds normal. No respiratory distress. He has no wheezes. He has no rales.  Abdominal: Soft. Bowel sounds are normal. He exhibits no distension and no mass. There is no tenderness. There is no rebound and no guarding.    Genitourinary: Rectum normal and prostate normal. Rectal exam shows no external hemorrhoid, no internal hemorrhoid, no fissure, no mass, no tenderness and anal tone normal. Prostate is not enlarged (20gm) and not tender.  Musculoskeletal: Normal range of motion. He exhibits no edema.  No pain with int/ext rotation at hip but tightness noted. No pain at GTB bilaterally.   Lymphadenopathy:    He has no cervical adenopathy.  Neurological: He is alert and oriented to person, place, and time.  CN grossly intact, station and gait intact  Skin: Skin is warm and dry. No rash noted.  Psychiatric: He has a normal mood and affect. His behavior is normal. Judgment and thought content normal.  Nursing note and vitals reviewed.  Results for orders placed or performed in visit on 10/02/15  Lipid panel  Result Value Ref Range   Cholesterol 201 (H) 0 - 200 mg/dL   Triglycerides (H) 0.0 - 149.0 mg/dL    161.0 Triglyceride is over 400; calculations on Lipids are invalid.   HDL 26.80 (L) >39.00 mg/dL   Total CHOL/HDL Ratio 8   PSA  Result Value Ref Range   PSA 0.65 0.10 - 4.00 ng/mL  Basic metabolic panel  Result Value Ref Range   Sodium 143 135 - 145 mEq/L   Potassium 3.9 3.5 - 5.1 mEq/L   Chloride 102 96 - 112 mEq/L   CO2 33 (H) 19 - 32 mEq/L   Glucose, Bld 97 70 - 99 mg/dL   BUN 16 6 - 23 mg/dL   Creatinine, Ser 9.60 0.40 - 1.50 mg/dL   Calcium 9.2 8.4 - 45.4 mg/dL   GFR 09.81 >19.14 mL/min  LDL cholesterol, direct  Result Value Ref Range   Direct LDL 70.0 mg/dL      Assessment & Plan:   Problem List Items Addressed This Visit    Left hip pain    Exam again with significant stiffness of hips bilaterally. Offered xray to eval arthritic burden of L hip - pt declines for now.      Hypertriglyceridemia    Severe today - first time. Declines significant greasy food intake. Will return tomorrow am to recheck trig.      Relevant Medications   amLODipine (NORVASC) 10 MG tablet    enalapril (VASOTEC) 20 MG tablet   metoprolol tartrate (LOPRESSOR) 25 MG tablet   triamterene-hydrochlorothiazide (MAXZIDE-25) 37.5-25 MG tablet   Other Relevant Orders   TSH   Lipid panel   HTN (hypertension)    Chronic, stable. Continue current regimen.      Relevant Medications   amLODipine (NORVASC) 10 MG tablet   enalapril (VASOTEC) 20 MG tablet   metoprolol tartrate (LOPRESSOR) 25 MG tablet   triamterene-hydrochlorothiazide (MAXZIDE-25) 37.5-25 MG tablet   Healthcare maintenance - Primary    Preventative protocols reviewed and updated unless pt declined. Discussed healthy diet and lifestyle.        Other Visit Diagnoses    Need for influenza vaccination        Relevant Orders    Flu Vaccine QUAD 36+ mos PF IM (Fluarix & Fluzone Quad PF) (Completed)        Follow up plan: Return in about 1 year (around  10/08/2016), or as needed, for annual exam, prior fasting for blood work.

## 2015-10-09 NOTE — Assessment & Plan Note (Signed)
Exam again with significant stiffness of hips bilaterally. Offered xray to eval arthritic burden of L hip - pt declines for now.

## 2015-10-09 NOTE — Progress Notes (Signed)
Pre visit review using our clinic review tool, if applicable. No additional management support is needed unless otherwise documented below in the visit note. 

## 2015-10-09 NOTE — Patient Instructions (Addendum)
Return tomorrow morning am fasting to recheck labs. You are doing well. Return as needed or in 1 year for next physical.  Health Maintenance, Male A healthy lifestyle and preventative care can promote health and wellness.  Maintain regular health, dental, and eye exams.  Eat a healthy diet. Foods like vegetables, fruits, whole grains, low-fat dairy products, and lean protein foods contain the nutrients you need and are low in calories. Decrease your intake of foods high in solid fats, added sugars, and salt. Get information about a proper diet from your health care provider, if necessary.  Regular physical exercise is one of the most important things you can do for your health. Most adults should get at least 150 minutes of moderate-intensity exercise (any activity that increases your heart rate and causes you to sweat) each week. In addition, most adults need muscle-strengthening exercises on 2 or more days a week.   Maintain a healthy weight. The body mass index (BMI) is a screening tool to identify possible weight problems. It provides an estimate of body fat based on height and weight. Your health care provider can find your BMI and can help you achieve or maintain a healthy weight. For males 20 years and older:  A BMI below 18.5 is considered underweight.  A BMI of 18.5 to 24.9 is normal.  A BMI of 25 to 29.9 is considered overweight.  A BMI of 30 and above is considered obese.  Maintain normal blood lipids and cholesterol by exercising and minimizing your intake of saturated fat. Eat a balanced diet with plenty of fruits and vegetables. Blood tests for lipids and cholesterol should begin at age 75 and be repeated every 5 years. If your lipid or cholesterol levels are high, you are over age 69, or you are at high risk for heart disease, you may need your cholesterol levels checked more frequently.Ongoing high lipid and cholesterol levels should be treated with medicines if diet and  exercise are not working.  If you smoke, find out from your health care provider how to quit. If you do not use tobacco, do not start.  Lung cancer screening is recommended for adults aged 55-80 years who are at high risk for developing lung cancer because of a history of smoking. A yearly low-dose CT scan of the lungs is recommended for people who have at least a 30-pack-year history of smoking and are current smokers or have quit within the past 15 years. A pack year of smoking is smoking an average of 1 pack of cigarettes a day for 1 year (for example, a 30-pack-year history of smoking could mean smoking 1 pack a day for 30 years or 2 packs a day for 15 years). Yearly screening should continue until the smoker has stopped smoking for at least 15 years. Yearly screening should be stopped for people who develop a health problem that would prevent them from having lung cancer treatment.  If you choose to drink alcohol, do not have more than 2 drinks per day. One drink is considered to be 12 oz (360 mL) of beer, 5 oz (150 mL) of wine, or 1.5 oz (45 mL) of liquor.  Avoid the use of street drugs. Do not share needles with anyone. Ask for help if you need support or instructions about stopping the use of drugs.  High blood pressure causes heart disease and increases the risk of stroke. High blood pressure is more likely to develop in:  People who have blood pressure in  the end of the normal range (100-139/85-89 mm Hg).  People who are overweight or obese.  People who are African American.  If you are 26-37 years of age, have your blood pressure checked every 3-5 years. If you are 44 years of age or older, have your blood pressure checked every year. You should have your blood pressure measured twice--once when you are at a hospital or clinic, and once when you are not at a hospital or clinic. Record the average of the two measurements. To check your blood pressure when you are not at a hospital or  clinic, you can use:  An automated blood pressure machine at a pharmacy.  A home blood pressure monitor.  If you are 12-27 years old, ask your health care provider if you should take aspirin to prevent heart disease.  Diabetes screening involves taking a blood sample to check your fasting blood sugar level. This should be done once every 3 years after age 39 if you are at a normal weight and without risk factors for diabetes. Testing should be considered at a younger age or be carried out more frequently if you are overweight and have at least 1 risk factor for diabetes.  Colorectal cancer can be detected and often prevented. Most routine colorectal cancer screening begins at the age of 77 and continues through age 77. However, your health care provider may recommend screening at an earlier age if you have risk factors for colon cancer. On a yearly basis, your health care provider may provide home test kits to check for hidden blood in the stool. A small camera at the end of a tube may be used to directly examine the colon (sigmoidoscopy or colonoscopy) to detect the earliest forms of colorectal cancer. Talk to your health care provider about this at age 16 when routine screening begins. A direct exam of the colon should be repeated every 5-10 years through age 16, unless early forms of precancerous polyps or small growths are found.  People who are at an increased risk for hepatitis B should be screened for this virus. You are considered at high risk for hepatitis B if:  You were born in a country where hepatitis B occurs often. Talk with your health care provider about which countries are considered high risk.  Your parents were born in a high-risk country and you have not received a shot to protect against hepatitis B (hepatitis B vaccine).  You have HIV or AIDS.  You use needles to inject street drugs.  You live with, or have sex with, someone who has hepatitis B.  You are a man who has  sex with other men (MSM).  You get hemodialysis treatment.  You take certain medicines for conditions like cancer, organ transplantation, and autoimmune conditions.  Hepatitis C blood testing is recommended for all people born from 18 through 1965 and any individual with known risk factors for hepatitis C.  Healthy men should no longer receive prostate-specific antigen (PSA) blood tests as part of routine cancer screening. Talk to your health care provider about prostate cancer screening.  Testicular cancer screening is not recommended for adolescents or adult males who have no symptoms. Screening includes self-exam, a health care provider exam, and other screening tests. Consult with your health care provider about any symptoms you have or any concerns you have about testicular cancer.  Practice safe sex. Use condoms and avoid high-risk sexual practices to reduce the spread of sexually transmitted infections (STIs).  You  should be screened for STIs, including gonorrhea and chlamydia if:  You are sexually active and are younger than 24 years.  You are older than 24 years, and your health care provider tells you that you are at risk for this type of infection.  Your sexual activity has changed since you were last screened, and you are at an increased risk for chlamydia or gonorrhea. Ask your health care provider if you are at risk.  If you are at risk of being infected with HIV, it is recommended that you take a prescription medicine daily to prevent HIV infection. This is called pre-exposure prophylaxis (PrEP). You are considered at risk if:  You are a man who has sex with other men (MSM).  You are a heterosexual man who is sexually active with multiple partners.  You take drugs by injection.  You are sexually active with a partner who has HIV.  Talk with your health care provider about whether you are at high risk of being infected with HIV. If you choose to begin PrEP, you should  first be tested for HIV. You should then be tested every 3 months for as long as you are taking PrEP.  Use sunscreen. Apply sunscreen liberally and repeatedly throughout the day. You should seek shade when your shadow is shorter than you. Protect yourself by wearing long sleeves, pants, a wide-brimmed hat, and sunglasses year round whenever you are outdoors.  Tell your health care provider of new moles or changes in moles, especially if there is a change in shape or color. Also, tell your health care provider if a mole is larger than the size of a pencil eraser.  A one-time screening for abdominal aortic aneurysm (AAA) and surgical repair of large AAAs by ultrasound is recommended for men aged 34-75 years who are current or former smokers.  Stay current with your vaccines (immunizations).   This information is not intended to replace advice given to you by your health care provider. Make sure you discuss any questions you have with your health care provider.   Document Released: 03/05/2008 Document Revised: 09/28/2014 Document Reviewed: 02/02/2011 Elsevier Interactive Patient Education [employer].

## 2015-10-09 NOTE — Addendum Note (Signed)
Addended by: Eustaquio Boyden on: 10/09/2015 12:34 PM   Modules accepted: Kipp Brood

## 2015-10-09 NOTE — Assessment & Plan Note (Signed)
Preventative protocols reviewed and updated unless pt declined. Discussed healthy diet and lifestyle.  

## 2015-10-09 NOTE — Assessment & Plan Note (Signed)
Chronic, stable. Continue current regimen. 

## 2016-05-04 ENCOUNTER — Telehealth: Payer: Self-pay | Admitting: Family Medicine

## 2016-05-04 NOTE — Telephone Encounter (Signed)
Patient called and said he spoke to [place] last month about a bill from [facility]. His d.o.b. Was switched with his son's.  Please call patient because [facility].

## 2016-05-05 NOTE — Telephone Encounter (Signed)
Called pt and notified him about the note that was placed by billing to let him know [employer] needs additional info.

## 2016-10-28 ENCOUNTER — Other Ambulatory Visit: Payer: Self-pay | Admitting: Family Medicine

## 2016-11-28 ENCOUNTER — Other Ambulatory Visit: Payer: Self-pay | Admitting: Family Medicine

## 2016-11-30 ENCOUNTER — Encounter: Payer: Self-pay | Admitting: *Deleted

## 2017-01-04 ENCOUNTER — Other Ambulatory Visit: Payer: Self-pay | Admitting: Family Medicine

## 2017-01-11 ENCOUNTER — Other Ambulatory Visit: Payer: Self-pay

## 2017-01-11 MED ORDER — AMLODIPINE BESYLATE 10 MG PO TABS: 10.0000 mg | ORAL_TABLET | Freq: Every day | ORAL | 0 refills | Status: DC

## 2017-01-11 MED ORDER — ENALAPRIL MALEATE 20 MG PO TABS: 20.0000 mg | ORAL_TABLET | Freq: Two times a day (BID) | ORAL | 0 refills | Status: DC

## 2017-01-11 NOTE — Telephone Encounter (Signed)
Pt request refill amlodipine and enalapril to [facility]. Refilled per protocol; pt has CPX scheduled 01/26/17. Pt will keep appt.

## 2017-01-16 ENCOUNTER — Other Ambulatory Visit: Payer: Self-pay | Admitting: Family Medicine

## 2017-01-16 DIAGNOSIS — E781 Pure hyperglyceridemia: Secondary | ICD-10-CM

## 2017-01-16 DIAGNOSIS — Z1159 Encounter for screening for other viral diseases: Secondary | ICD-10-CM

## 2017-01-16 DIAGNOSIS — Z125 Encounter for screening for malignant neoplasm of prostate: Secondary | ICD-10-CM

## 2017-01-16 DIAGNOSIS — I10 Essential (primary) hypertension: Secondary | ICD-10-CM

## 2017-01-19 ENCOUNTER — Other Ambulatory Visit (INDEPENDENT_AMBULATORY_CARE_PROVIDER_SITE_OTHER): Payer: Self-pay

## 2017-01-19 DIAGNOSIS — E781 Pure hyperglyceridemia: Secondary | ICD-10-CM

## 2017-01-19 DIAGNOSIS — R7989 Other specified abnormal findings of blood chemistry: Secondary | ICD-10-CM

## 2017-01-19 DIAGNOSIS — Z125 Encounter for screening for malignant neoplasm of prostate: Secondary | ICD-10-CM

## 2017-01-19 DIAGNOSIS — Z1159 Encounter for screening for other viral diseases: Secondary | ICD-10-CM

## 2017-01-19 LAB — COMPREHENSIVE METABOLIC PANEL
ALT: 13 U/L (ref 0–53)
AST: 13 U/L (ref 0–37)
Albumin: 4.3 g/dL (ref 3.5–5.2)
Alkaline Phosphatase: 71 U/L (ref 39–117)
BUN: 18 mg/dL (ref 6–23)
CALCIUM: 9.6 mg/dL (ref 8.4–10.5)
CO2: 32 mEq/L (ref 19–32)
Chloride: 104 mEq/L (ref 96–112)
Creatinine, Ser: 1.04 mg/dL (ref 0.40–1.50)
GFR: 77.66 mL/min (ref >60.00)
Glucose, Bld: 105 mg/dL — ABNORMAL HIGH (ref 70–99)
POTASSIUM: 5.2 meq/L — AB (ref 3.5–5.1)
Sodium: 144 mEq/L (ref 135–145)
TOTAL PROTEIN: 7.3 g/dL (ref 6.0–8.3)
Total Bilirubin: 0.4 mg/dL (ref 0.2–1.2)

## 2017-01-19 LAB — LIPID PANEL
CHOL/HDL RATIO: 6
CHOLESTEROL: 222 mg/dL — AB (ref 0–200)
HDL: 34.8 mg/dL — AB (ref >39.00)
NONHDL: 186.72
TRIGLYCERIDES: 282 mg/dL — AB (ref 0.0–149.0)
VLDL: 56.4 mg/dL — ABNORMAL HIGH (ref 0.0–40.0)

## 2017-01-19 LAB — PSA: PSA: 0.74 ng/mL (ref 0.10–4.00)

## 2017-01-19 LAB — LDL CHOLESTEROL, DIRECT: Direct LDL: 134 mg/dL

## 2017-01-20 LAB — HEPATITIS C ANTIBODY: HCV Ab: NEGATIVE

## 2017-01-26 ENCOUNTER — Ambulatory Visit (INDEPENDENT_AMBULATORY_CARE_PROVIDER_SITE_OTHER): Payer: 59 | Admitting: Family Medicine

## 2017-01-26 ENCOUNTER — Encounter: Payer: Self-pay | Admitting: Family Medicine

## 2017-01-26 VITALS — BP 156/100 | HR 57 | Temp 98.2°F | Ht 70.0 in | Wt 181.5 lb

## 2017-01-26 DIAGNOSIS — Z0000 Encounter for general adult medical examination without abnormal findings: Secondary | ICD-10-CM | POA: Diagnosis not present

## 2017-01-26 DIAGNOSIS — E785 Hyperlipidemia, unspecified: Secondary | ICD-10-CM

## 2017-01-26 DIAGNOSIS — M25652 Stiffness of left hip, not elsewhere classified: Secondary | ICD-10-CM

## 2017-01-26 DIAGNOSIS — I10 Essential (primary) hypertension: Secondary | ICD-10-CM | POA: Diagnosis not present

## 2017-01-26 DIAGNOSIS — M25651 Stiffness of right hip, not elsewhere classified: Secondary | ICD-10-CM | POA: Diagnosis not present

## 2017-01-26 MED ORDER — ENALAPRIL MALEATE 20 MG PO TABS: 20.0000 mg | ORAL_TABLET | Freq: Two times a day (BID) | ORAL | 3 refills | Status: DC

## 2017-01-26 MED ORDER — ATORVASTATIN CALCIUM 40 MG PO TABS: 40.0000 mg | ORAL_TABLET | Freq: Every day | ORAL | 6 refills | Status: DC

## 2017-01-26 MED ORDER — TRIAMTERENE-HCTZ 37.5-25 MG PO TABS: 1.0000 | ORAL_TABLET | Freq: Every day | ORAL | 3 refills | Status: DC

## 2017-01-26 MED ORDER — METOPROLOL TARTRATE 25 MG PO TABS: 25.0000 mg | ORAL_TABLET | Freq: Two times a day (BID) | ORAL | 3 refills | Status: DC

## 2017-01-26 MED ORDER — AMLODIPINE BESYLATE 10 MG PO TABS: 10.0000 mg | ORAL_TABLET | Freq: Every day | ORAL | 3 refills | Status: DC

## 2017-01-26 NOTE — Progress Notes (Signed)
Pre visit review using our clinic review tool, if applicable. No additional management support is needed unless otherwise documented below in the visit note. 

## 2017-01-26 NOTE — Patient Instructions (Addendum)
Return in 2-3 months to recheck blood pressure - bring home cuff - and cholesterol levels. Let us know if you'd like hip xrays to evaluate hip stiffness.  Cholesterol is staying too high - start lipitor 40mg  daily. Watch for muscle aches. If this happens, decrease to every other day dosing. Good to see you today, call us with questions.   Health Maintenance, Male A healthy lifestyle and preventive care is important for your health and wellness. Ask your health care provider about what schedule of regular examinations is right for you. What should I know about weight and diet?  Eat a Healthy Diet  Eat plenty of vegetables, fruits, whole grains, low-fat dairy products, and lean protein.  Do not eat a lot of foods high in solid fats, added sugars, or salt. Maintain a Healthy Weight  Regular exercise can help you achieve or maintain a healthy weight. You should:  Do at least 150 minutes of exercise each week. The exercise should increase your heart rate and make you sweat (moderate-intensity exercise).  Do strength-training exercises at least twice a week. Watch Your Levels of Cholesterol and Blood Lipids  Have your blood tested for lipids and cholesterol every 5 years starting at 59 years of age. If you are at high risk for heart disease, you should start having your blood tested when you are 59 years old. You may need to have your cholesterol levels checked more often if:  Your lipid or cholesterol levels are high.  You are older than 59 years of age.  You are at high risk for heart disease. What should I know about cancer screening? Many types of cancers can be detected early and may often be prevented. Lung Cancer  You should be screened every year for lung cancer if:  You are a current smoker who has smoked for at least 30 years.  You are a former smoker who has quit within the past 15 years.  Talk to your health care provider about your screening options, when you should start  screening, and how often you should be screened. Colorectal Cancer  Routine colorectal cancer screening usually begins at 59 years of age and should be repeated every 5-10 years until you are 59 years old. You may need to be screened more often if early forms of precancerous polyps or small growths are found. Your health care provider may recommend screening at an earlier age if you have risk factors for colon cancer.  Your health care provider may recommend using home test kits to check for hidden blood in the stool.  A small camera at the end of a tube can be used to examine your colon (sigmoidoscopy or colonoscopy). This checks for the earliest forms of colorectal cancer. Prostate and Testicular Cancer  Depending on your age and overall health, your health care provider may do certain tests to screen for prostate and testicular cancer.  Talk to your health care provider about any symptoms or concerns you have about testicular or prostate cancer. Skin Cancer  Check your skin from head to toe regularly.  Tell your health care provider about any new moles or changes in moles, especially if:  There is a change in a mole's size, shape, or color.  You have a mole that is larger than a pencil eraser.  Always use sunscreen. Apply sunscreen liberally and repeat throughout the day.  Protect yourself by wearing long sleeves, pants, a wide-brimmed hat, and sunglasses when outside. What should I know about  heart disease, diabetes, and high blood pressure?  If you are 72-6 years of age, have your blood pressure checked every 3-5 years. If you are 52 years of age or older, have your blood pressure checked every year. You should have your blood pressure measured twice-once when you are at a hospital or clinic, and once when you are not at a hospital or clinic. Record the average of the two measurements. To check your blood pressure when you are not at a hospital or clinic, you can use:  An  automated blood pressure machine at a pharmacy.  A home blood pressure monitor.  Talk to your health care provider about your target blood pressure.  If you are between 66-27 years old, ask your health care provider if you should take aspirin to prevent heart disease.  Have regular diabetes screenings by checking your fasting blood sugar level.  If you are at a normal weight and have a low risk for diabetes, have this test once every three years after the age of 78.  If you are overweight and have a high risk for diabetes, consider being tested at a younger age or more often.  A one-time screening for abdominal aortic aneurysm (AAA) by ultrasound is recommended for men aged 65-75 years who are current or former smokers. What should I know about preventing infection? Hepatitis B  If you have a higher risk for hepatitis B, you should be screened for this virus. Talk with your health care provider to find out if you are at risk for hepatitis B infection. Hepatitis C  Blood testing is recommended for:  Everyone born from [birth year] through 1963.  Anyone with known risk factors for hepatitis C. Sexually Transmitted Diseases (STDs)  You should be screened each year for STDs including gonorrhea and chlamydia if:  You are sexually active and are younger than 59 years of age.  You are older than 59 years of age and your health care provider tells you that you are at risk for this type of infection.  Your sexual activity has changed since you were last screened and you are at an increased risk for chlamydia or gonorrhea. Ask your health care provider if you are at risk.  Talk with your health care provider about whether you are at high risk of being infected with HIV. Your health care provider may recommend a prescription medicine to help prevent HIV infection. What else can I do?  Schedule regular health, dental, and eye exams.  Stay current with your vaccines (immunizations).  Do not use  any tobacco products, such as cigarettes, chewing tobacco, and e-cigarettes. If you need help quitting, ask your health care provider.  Limit alcohol intake to no more than 2 drinks per day. One drink equals 12 ounces of beer, 5 ounces of wine, or 1 ounces of hard liquor.  Do not use street drugs.  Do not share needles.  Ask your health care provider for help if you need support or information about quitting drugs.  Tell your health care provider if you often feel depressed.  Tell your health care provider if you have ever been abused or do not feel safe at home. This information is not intended to replace advice given to you by your health care provider. Make sure you discuss any questions you have with your health care provider. Document Released: 03/05/2008 Document Revised: 05/06/2016 Document Reviewed: 06/11/2015 Elsevier Interactive Patient Education  2017 [employer].

## 2017-01-26 NOTE — Assessment & Plan Note (Signed)
Not as much pain as would be expected from end stage OA. Again offered xray - pt declined. He will consider and we will reassess at 2-3 mo f/u visit.

## 2017-01-26 NOTE — Assessment & Plan Note (Addendum)
Reviewed FLP with patient.  Discussed benefits of statin.  ASCVD 10 yr risk = 18.7%.  Pt agrees to start lipitor 40mg  daily. If myalgias, will try QOD dosing.

## 2017-01-26 NOTE — Assessment & Plan Note (Addendum)
Chronic, elevated reading today. Continue current regimen. Reassess at f/u. I asked him to bring bp cuff to next visit to compare.

## 2017-01-26 NOTE — Assessment & Plan Note (Signed)
Preventative protocols reviewed and updated unless pt declined. Discussed healthy diet and lifestyle.  

## 2017-01-26 NOTE — Progress Notes (Signed)
BP (!) 156/100 (BP Location: Right Arm, Cuff Size: Normal)   Pulse (!) 57   Temp 98.2 F (36.8 C) (Oral)   Ht 5\' 10"  (1.778 m)   Wt 181 lb 8 oz (82.3 kg)   SpO2 95%   BMI 26.04 kg/m    CC: CPE Subjective:    Patient ID: Roy Galvan, male    DOB: September 25, 1955, 61 y.o.   MRN: [id number]  HPI: Roy CourtsWilliam T Galvan is a 61 y.o. male presenting on 01/26/2017 for Annual Exam   Son recently diagnosed with kidney disease (?post streptococcal glomerulonephritis) pending renal transplant. The whole family is on a renal diet. Pt's wife to donate kidney - [facility].  bp at home runs well controlled - 120-130/80s.   Preventative: COLONOSCOPY Date: 2011 WNL, rec rpt 10 yrs (Dr. Bluford Kaufmannh) Prostate cancer screening - father with h/o prostate cancer. Desires yearly screen.  Flu shot yearly  Tetanus 2011  Pneumovax unsure  Seat belt use discussed Sunscreen use discussed. 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 Interim medical history since our last visit reviewed. Allergies and medications reviewed and updated. Outpatient Medications Prior to Visit  Medication Sig Dispense Refill  . aspirin 81 MG tablet Take 81 mg by mouth daily.    Marland Kitchen. amLODipine (NORVASC) 10 MG tablet Take 1 tablet (10 mg total) by mouth daily. 30 tablet 0  . enalapril (VASOTEC) 20 MG tablet Take 1 tablet (20 mg total) by mouth 2 (two) times daily. 60 tablet 0  . metoprolol tartrate (LOPRESSOR) 25 MG tablet Take 1 tablet (25 mg total) by mouth 2 (two) times daily. 60 tablet 0  . triamterene-hydrochlorothiazide (MAXZIDE-25) 37.5-25 MG tablet TAKE 1 TABLET BY MOUTH DAILY 30 tablet 0   No  facility-administered medications prior to visit.      Per HPI unless specifically indicated in ROS section below Review of Systems  Constitutional: Negative for activity change, appetite change, chills, fatigue, fever and unexpected weight change.  HENT: Negative for hearing loss.   Eyes: Negative for visual disturbance.  Respiratory: Negative for cough, chest tightness, shortness of breath and wheezing.   Cardiovascular: Negative for chest pain, palpitations and leg swelling.  Gastrointestinal: Negative for abdominal distention, abdominal pain, blood in stool, constipation, diarrhea, nausea and vomiting.  Genitourinary: Negative for difficulty urinating and hematuria.  Musculoskeletal: Negative for arthralgias, myalgias and neck pain.  Skin: Negative for rash.  Neurological: Negative for dizziness, seizures, syncope and headaches.  Hematological: Negative for adenopathy. Does not bruise/bleed easily.  Psychiatric/Behavioral: Negative for dysphoric mood. The patient is not nervous/anxious.        Objective:    BP (!) 156/100 (BP Location: Right Arm, Cuff Size: Normal)   Pulse (!) 57   Temp 98.2 F (36.8 C) (Oral)   Ht 5\' 10"  (1.778 m)   Wt 181 lb 8 oz (82.3 kg)   SpO2 95%   BMI 26.04 kg/m   Wt Readings from Last 3 Encounters:  01/26/17 181 lb 8 oz (82.3 kg)  10/09/15 184 lb 8 oz (83.7 kg)  08/22/14 199 lb 8 oz (90.5 kg)    Physical Exam  Constitutional:  He is oriented to person, place, and time. He appears well-developed and well-nourished. No distress.  HENT:  Head: Normocephalic and atraumatic.  Right Ear: Hearing, tympanic membrane, external ear and ear canal normal.  Left Ear: Hearing, tympanic membrane, external ear and ear canal normal.  Nose: Nose normal.  Mouth/Throat: Uvula is midline, oropharynx is clear and moist and mucous membranes are normal. No oropharyngeal exudate, posterior oropharyngeal edema or posterior oropharyngeal erythema.  Eyes: Conjunctivae and  EOM are normal. Pupils are equal, round, and reactive to light. No scleral icterus.  Neck: Normal range of motion. Neck supple. No thyromegaly present.  Cardiovascular: Normal rate, regular rhythm, normal heart sounds and intact distal pulses.   No murmur heard. Pulses:      Radial pulses are 2+ on the right side, and 2+ on the left side.  Pulmonary/Chest: Effort normal and breath sounds normal. No respiratory distress. He has no wheezes. He has no rales.  Abdominal: Soft. Bowel sounds are normal. He exhibits no distension and no mass. There is no tenderness. There is no rebound and no guarding.  Genitourinary: Rectum normal and prostate normal. Rectal exam shows no external hemorrhoid, no internal hemorrhoid, no fissure, no mass, no tenderness and anal tone normal. Prostate is not enlarged (20gm) and not tender.  Musculoskeletal: Normal range of motion. He exhibits no edema.  Bilateral hip stiffness without significant pain with int/ext rotation at hip joint  Lymphadenopathy:    He has no cervical adenopathy.  Neurological: He is alert and oriented to person, place, and time.  CN grossly intact, station and gait intact  Skin: Skin is warm and dry. No rash noted.  Psychiatric: He has a normal mood and affect. His behavior is normal. Judgment and thought content normal.  Nursing note and vitals reviewed.  Results for orders placed or performed in visit on 01/19/17  Lipid panel  Result Value Ref Range   Cholesterol 222 (H) 0 - 200 mg/dL   Triglycerides 409.8 (H) 0.0 - 149.0 mg/dL   HDL 11.91 (L) >47.82 mg/dL   VLDL 95.6 (H) 0.0 - 21.3 mg/dL   Total CHOL/HDL Ratio 6    NonHDL 186.72   Comprehensive metabolic panel  Result Value Ref Range   Sodium 144 135 - 145 mEq/L   Potassium 5.2 (H) 3.5 - 5.1 mEq/L   Chloride 104 96 - 112 mEq/L   CO2 32 19 - 32 mEq/L   Glucose, Bld 105 (H) 70 - 99 mg/dL   BUN 18 6 - 23 mg/dL   Creatinine, Ser 0.86 0.40 - 1.50 mg/dL   Total Bilirubin 0.4 0.2 - 1.2  mg/dL   Alkaline Phosphatase 71 39 - 117 U/L   AST 13 0 - 37 U/L   ALT 13 0 - 53 U/L   Total Protein 7.3 6.0 - 8.3 g/dL   Albumin 4.3 3.5 - 5.2 g/dL   Calcium 9.6 8.4 - 57.8 mg/dL   GFR 46.96 >29.52 mL/min  PSA  Result Value Ref Range   PSA 0.74 0.10 - 4.00 ng/mL  Hepatitis C antibody  Result Value Ref Range   HCV Ab NEGATIVE NEGATIVE  LDL cholesterol, direct  Result Value Ref Range   Direct LDL 134.0 mg/dL      Assessment & Plan:   Problem List Items Addressed This Visit    Dyslipidemia    Reviewed FLP with patient.  Discussed benefits of statin.  ASCVD 10 yr risk = 18.7%.  Pt agrees to start lipitor 40mg  daily.  If myalgias, will try QOD dosing.       Relevant Medications   atorvastatin (LIPITOR) 40 MG tablet   Healthcare maintenance - Primary    Preventative protocols reviewed and updated unless pt declined. Discussed healthy diet and lifestyle.       Hip joint stiffness, bilateral    Not as much pain as would be expected from end stage OA. Again offered xray - pt declined. He will consider and we will reassess at 2-3 mo f/u visit.       HTN (hypertension)    Chronic, elevated reading today. Continue current regimen. Reassess at f/u. I asked him to bring bp cuff to next visit to compare.       Relevant Medications   amLODipine (NORVASC) 10 MG tablet   enalapril (VASOTEC) 20 MG tablet   metoprolol tartrate (LOPRESSOR) 25 MG tablet   triamterene-hydrochlorothiazide (MAXZIDE-25) 37.5-25 MG tablet   atorvastatin (LIPITOR) 40 MG tablet       Follow up plan: Return in about 3 months (around 04/28/2017) for follow up visit.  Eustaquio Boyden, MD

## 2017-01-31 ENCOUNTER — Other Ambulatory Visit: Payer: Self-pay | Admitting: Family Medicine

## 2017-02-07 ENCOUNTER — Other Ambulatory Visit: Payer: Self-pay | Admitting: Family Medicine

## 2017-03-21 ENCOUNTER — Other Ambulatory Visit: Payer: Self-pay | Admitting: Family Medicine

## 2017-03-21 DIAGNOSIS — R739 Hyperglycemia, unspecified: Secondary | ICD-10-CM

## 2017-03-21 DIAGNOSIS — E785 Hyperlipidemia, unspecified: Secondary | ICD-10-CM

## 2017-03-22 ENCOUNTER — Other Ambulatory Visit: Payer: 59

## 2017-03-22 ENCOUNTER — Encounter (INDEPENDENT_AMBULATORY_CARE_PROVIDER_SITE_OTHER): Payer: Self-pay

## 2017-03-29 ENCOUNTER — Ambulatory Visit: Payer: 59 | Admitting: Family Medicine

## 2018-02-03 ENCOUNTER — Other Ambulatory Visit: Payer: Self-pay | Admitting: Family Medicine

## 2018-02-08 ENCOUNTER — Other Ambulatory Visit: Payer: Self-pay | Admitting: Family Medicine

## 2018-05-06 ENCOUNTER — Other Ambulatory Visit: Payer: Self-pay | Admitting: Family Medicine

## 2018-05-09 MED ORDER — AMLODIPINE BESYLATE 10 MG PO TABS: 10.0000 mg | ORAL_TABLET | Freq: Every day | ORAL | 0 refills | Status: DC

## 2018-05-09 MED ORDER — METOPROLOL TARTRATE 25 MG PO TABS: 25.0000 mg | ORAL_TABLET | Freq: Two times a day (BID) | ORAL | 0 refills | Status: DC

## 2018-05-09 MED ORDER — ENALAPRIL MALEATE 20 MG PO TABS: 20.0000 mg | ORAL_TABLET | Freq: Two times a day (BID) | ORAL | 0 refills | Status: DC

## 2018-05-09 MED ORDER — TRIAMTERENE-HCTZ 37.5-25 MG PO TABS: 1.0000 | ORAL_TABLET | Freq: Every day | ORAL | 0 refills | Status: DC

## 2018-05-09 NOTE — Addendum Note (Signed)
Addended by: Patience MuscaISLEY, RENA M on: 05/09/2018 11:08 AM   Modules accepted: Orders

## 2018-05-09 NOTE — Telephone Encounter (Signed)
Pt last CPX 01/26/17; refilled until CPX 06/21/18 to [facility] [place].

## 2018-05-09 NOTE — Telephone Encounter (Signed)
CPE is scheduled for 06/21/18, able to fill?

## 2018-06-08 ENCOUNTER — Encounter: Payer: Self-pay | Admitting: Family Medicine

## 2018-06-08 ENCOUNTER — Ambulatory Visit: Payer: Self-pay | Admitting: Family Medicine

## 2018-06-08 VITALS — BP 124/82 | HR 63 | Temp 98.2°F | Ht 70.0 in | Wt 175.8 lb

## 2018-06-08 DIAGNOSIS — E785 Hyperlipidemia, unspecified: Secondary | ICD-10-CM

## 2018-06-08 DIAGNOSIS — Z125 Encounter for screening for malignant neoplasm of prostate: Secondary | ICD-10-CM

## 2018-06-08 DIAGNOSIS — R5381 Other malaise: Secondary | ICD-10-CM | POA: Insufficient documentation

## 2018-06-08 DIAGNOSIS — R195 Other fecal abnormalities: Secondary | ICD-10-CM

## 2018-06-08 DIAGNOSIS — I10 Essential (primary) hypertension: Secondary | ICD-10-CM

## 2018-06-08 LAB — COMPREHENSIVE METABOLIC PANEL
ALT: 12 U/L (ref 0–53)
AST: 13 U/L (ref 0–37)
Albumin: 4.3 g/dL (ref 3.5–5.2)
Alkaline Phosphatase: 70 U/L (ref 39–117)
BUN: 13 mg/dL (ref 6–23)
CALCIUM: 9.2 mg/dL (ref 8.4–10.5)
CHLORIDE: 104 meq/L (ref 96–112)
CO2: 32 mEq/L (ref 19–32)
Creatinine, Ser: 1 mg/dL (ref 0.40–1.50)
GFR: 80.87 mL/min (ref >60.00)
Glucose, Bld: 102 mg/dL — ABNORMAL HIGH (ref 70–99)
Potassium: 3.7 mEq/L (ref 3.5–5.1)
Sodium: 142 mEq/L (ref 135–145)
Total Bilirubin: 0.5 mg/dL (ref 0.2–1.2)
Total Protein: 7.1 g/dL (ref 6.0–8.3)

## 2018-06-08 LAB — CBC WITH DIFFERENTIAL/PLATELET
BASOS ABS: 0.2 10*3/uL — AB (ref 0.0–0.1)
Basophils Relative: 1.1 % (ref 0.0–3.0)
Eosinophils Absolute: 0.2 10*3/uL (ref 0.0–0.7)
Eosinophils Relative: 1.8 % (ref 0.0–5.0)
HCT: 40.3 % (ref 39.0–52.0)
Hemoglobin: 13.6 g/dL (ref 13.0–17.0)
Lymphocytes Relative: 20.3 % (ref 12.0–46.0)
Lymphs Abs: 2.8 10*3/uL (ref 0.7–4.0)
MCHC: 33.7 g/dL (ref 30.0–36.0)
MCV: 89 fl (ref 78.0–100.0)
MONO ABS: 0.6 10*3/uL (ref 0.1–1.0)
MONOS PCT: 4.7 % (ref 3.0–12.0)
Neutro Abs: 10.1 10*3/uL — ABNORMAL HIGH (ref 1.4–7.7)
Neutrophils Relative %: 72.1 % (ref 43.0–77.0)
Platelets: 386 10*3/uL (ref 150.0–400.0)
RBC: 4.53 Mil/uL (ref 4.22–5.81)
RDW: 13.4 % (ref 11.5–15.5)
WBC: 13.9 10*3/uL — ABNORMAL HIGH (ref 4.0–10.5)

## 2018-06-08 LAB — LIPID PANEL
CHOL/HDL RATIO: 5
Cholesterol: 129 mg/dL (ref 0–200)
HDL: 26.3 mg/dL — AB (ref >39.00)
LDL Cholesterol: 78 mg/dL (ref 0–99)
NonHDL: 102.76
Triglycerides: 125 mg/dL (ref 0.0–149.0)
VLDL: 25 mg/dL (ref 0.0–40.0)

## 2018-06-08 LAB — TSH: TSH: 1.15 u[IU]/mL (ref 0.35–4.50)

## 2018-06-08 LAB — PSA: PSA: 0.8 ng/mL (ref 0.10–4.00)

## 2018-06-08 NOTE — Progress Notes (Signed)
BP 124/82 (BP Location: Left Arm, Patient Position: Sitting, Cuff Size: Normal)   Pulse 63   Temp 98.2 F (36.8 C) (Oral)   Ht 5\' 10"  (1.778 m)   Wt 175 lb 12 oz (79.7 kg)   SpO2 96%   BMI 25.22 kg/m    CC: diarrhea Subjective:    Patient ID: Roy Galvan, male    DOB: 08-05-1956, 62 y.o.   MRN: [id number]  HPI: Roy Galvan is a 62 y.o. male presenting on 06/08/2018 for Diarrhea (C/o diarrhea off and on for more than 2 wks. Denies any N/V. Pt accompanied by his wife. )   End of July felt he got overheated while working outdoors. Hasn't felt well since then.  Ongoing diarrhea for last several weeks. Loose stools about once a day - may be worse with spicy foods.   No fevers/chills, blood or mucous in stool, abd pain, nausea/vomiting.  No sick contacts at home.  No recent travel.  No new foods or restaurants or changes in diet.  Uses well water.   Relevant past medical, surgical, family and social history reviewed and updated as indicated. Interim medical history since our last visit reviewed. Allergies and medications reviewed and updated. Outpatient Medications Prior to Visit  Medication Sig Dispense Refill  . amLODipine (NORVASC) 10 MG tablet Take 1 tablet (10 mg total) by mouth daily. 90 tablet 0  . enalapril (VASOTEC) 20 MG tablet Take 1 tablet (20 mg total) by mouth 2 (two) times daily. 180 tablet 0  . metoprolol tartrate (LOPRESSOR) 25 MG tablet Take 1 tablet (25 mg total) by mouth 2 (two) times daily. 180 tablet 0  . triamterene-hydrochlorothiazide (MAXZIDE-25) 37.5-25 MG tablet Take 1 tablet by mouth daily. 90 tablet 0  . atorvastatin (LIPITOR) 40 MG tablet Take 1 tablet (40 mg total) by mouth daily. 30 tablet 6  . aspirin 81 MG tablet Take 81 mg by mouth daily.     No facility-administered medications prior to visit.      Per HPI unless specifically indicated in ROS section below Review of Systems     Objective:    BP 124/82 (BP Location: Left Arm, Patient  Position: Sitting, Cuff Size: Normal)   Pulse 63   Temp 98.2 F (36.8 C) (Oral)   Ht 5\' 10"  (1.778 m)   Wt 175 lb 12 oz (79.7 kg)   SpO2 96%   BMI 25.22 kg/m   Wt Readings from Last 3 Encounters:  06/08/18 175 lb 12 oz (79.7 kg)  01/26/17 181 lb 8 oz (82.3 kg)  10/09/15 184 lb 8 oz (83.7 kg)    Physical Exam  Constitutional: He appears well-developed and well-nourished. No distress.  HENT:  Mouth/Throat: Oropharynx is clear and moist. No oropharyngeal exudate.  Neck: Normal range of motion. Neck supple.  Cardiovascular: Normal rate, regular rhythm and normal heart sounds.  No murmur heard. Pulmonary/Chest: Effort normal and breath sounds normal. No respiratory distress. He has no wheezes. He has no rales.  Abdominal: Soft. Bowel sounds are normal. He exhibits no distension and no mass. There is no hepatosplenomegaly. There is no tenderness. There is no rigidity, no rebound, no guarding and no CVA tenderness. No hernia.  Musculoskeletal: He exhibits no edema.  Skin: Skin is warm and dry. No rash noted.  Nursing note and vitals reviewed.  Results for orders placed or performed in visit on 01/19/17  Lipid panel  Result Value Ref Range   Cholesterol 222 (H) 0 -  200 mg/dL   Triglycerides 161.0282.0 (H) 0.0 - 149.0 mg/dL   HDL 96.0434.80 (L) >54.09>39.00 mg/dL   VLDL 81.156.4 (H) 0.0 - 91.440.0 mg/dL   Total CHOL/HDL Ratio 6    NonHDL 186.72   Comprehensive metabolic panel  Result Value Ref Range   Sodium 144 135 - 145 mEq/L   Potassium 5.2 (H) 3.5 - 5.1 mEq/L   Chloride 104 96 - 112 mEq/L   CO2 32 19 - 32 mEq/L   Glucose, Bld 105 (H) 70 - 99 mg/dL   BUN 18 6 - 23 mg/dL   Creatinine, Ser 7.821.04 0.40 - 1.50 mg/dL   Total Bilirubin 0.4 0.2 - 1.2 mg/dL   Alkaline Phosphatase 71 39 - 117 U/L   AST 13 0 - 37 U/L   ALT 13 0 - 53 U/L   Total Protein 7.3 6.0 - 8.3 g/dL   Albumin 4.3 3.5 - 5.2 g/dL   Calcium 9.6 8.4 - 95.610.5 mg/dL   GFR 21.3077.66 >86.57>60.00 mL/min  PSA  Result Value Ref Range   PSA 0.74 0.10 -  4.00 ng/mL  Hepatitis C antibody  Result Value Ref Range   HCV Ab NEGATIVE NEGATIVE  LDL cholesterol, direct  Result Value Ref Range   Direct LDL 134.0 mg/dL      Assessment & Plan:  He will return in early October for physical, no need for labs prior.  Problem List Items Addressed This Visit    Malaise    Persistent malaise after episode of dehydration 2 months ago. Check labwork today. Discussed importance of good hydration status, avoiding dehydration in setting of ACEI and diuretic use.       Loose stools - Primary    Ongoing mild loose stools - 1 a day - over last few weeks. Check labs today.       Relevant Orders   Comprehensive metabolic panel   TSH   CBC with Differential/Platelet   HTN (hypertension)    Update labs.       Dyslipidemia    Has not been taking lipitor. Update FLP today The 10-year ASCVD risk score Denman George(Goff DC Jr., et al., 2013) is: 24.4%   Values used to calculate the score:     Age: [age] years     Sex: Male     Is Non-Hispanic African American: No     Diabetic: Yes     Tobacco smoker: No     Systolic Blood Pressure: 124 mmHg     Is BP treated: Yes     HDL Cholesterol: 34.8 mg/dL     Total Cholesterol: 222 mg/dL       Relevant Orders   Lipid panel   Comprehensive metabolic panel   TSH    Other Visit Diagnoses    Special screening for malignant neoplasm of prostate       Relevant Orders   PSA       No orders of the defined types were placed in this encounter.  Orders Placed This Encounter  Procedures  . Lipid panel  . Comprehensive metabolic panel  . TSH  . PSA  . CBC with Differential/Platelet    Follow up plan: Return if symptoms worsen or fail to improve.  Eustaquio BoydenJavier Clare Fennimore, MD

## 2018-06-08 NOTE — Assessment & Plan Note (Signed)
Update labs.  

## 2018-06-08 NOTE — Assessment & Plan Note (Signed)
Has not been taking lipitor. Update FLP today The 10-year ASCVD risk score Denman George(Goff DC Jr., et al., 2013) is: 24.4%   Values used to calculate the score:     Age: [age] years     Sex: Male     Is Non-Hispanic African American: No     Diabetic: Yes     Tobacco smoker: No     Systolic Blood Pressure: 124 mmHg     Is BP treated: Yes     HDL Cholesterol: 34.8 mg/dL     Total Cholesterol: 222 mg/dL

## 2018-06-08 NOTE — Assessment & Plan Note (Signed)
Ongoing mild loose stools - 1 a day - over last few weeks. Check labs today.

## 2018-06-08 NOTE — Patient Instructions (Signed)
Labs today.  Ensure good hydration status.  Continue god water intake, fiber intake to keep stools regular.

## 2018-06-08 NOTE — Assessment & Plan Note (Signed)
Persistent malaise after episode of dehydration 2 months ago. Check labwork today. Discussed importance of good hydration status, avoiding dehydration in setting of ACEI and diuretic use.

## 2018-06-21 ENCOUNTER — Encounter: Payer: 59 | Admitting: Family Medicine

## 2018-06-21 DIAGNOSIS — Z0289 Encounter for other administrative examinations: Secondary | ICD-10-CM

## 2018-06-21 NOTE — Assessment & Plan Note (Deleted)
Preventative protocols reviewed and updated unless pt declined. Discussed healthy diet and lifestyle.  

## 2018-06-21 NOTE — Progress Notes (Deleted)
There were no vitals taken for this visit.   CC: CPE Subjective:    Patient ID: Roy Galvan, male    DOB: [birth year], 62 y.o.   MRN: [id number]  HPI: Roy Galvan is a 62 y.o. male presenting on 06/21/2018 for No chief complaint on file.   Preventative: COLONOSCOPY Date: 2011 WNL, rec rpt 10 yrs (Dr. Bluford Kaufmann) Prostate cancer screening - father with h/o prostate cancer. Desires yearly screen.  Flu shot yearly  Tetanus 2011  Pneumovax unsure  Seat belt use discussed Sunscreen use discussed. 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 Interim medical history since our last visit reviewed. Allergies and medications reviewed and updated. Outpatient Medications Prior to Visit  Medication Sig Dispense Refill  . amLODipine (NORVASC) 10 MG tablet Take 1 tablet (10 mg total) by mouth daily. 90 tablet 0  . aspirin 81 MG tablet Take 81 mg by mouth daily.    . enalapril (VASOTEC) 20 MG tablet Take 1 tablet (20 mg total) by mouth 2 (two) times daily. 180 tablet 0  . metoprolol tartrate (LOPRESSOR) 25 MG tablet Take 1 tablet (25 mg total) by mouth 2 (two) times daily. 180 tablet 0  . triamterene-hydrochlorothiazide (MAXZIDE-25) 37.5-25 MG tablet Take 1 tablet by mouth daily. 90 tablet 0   No facility-administered medications prior to visit.      Per HPI unless specifically indicated in ROS section below Review of Systems     Objective:    There were no vitals taken for this visit.  Wt Readings from Last 3 Encounters:  06/08/18 175 lb 12 oz (79.7 kg)  01/26/17 181 lb 8 oz (82.3 kg)  10/09/15 184 lb 8 oz (83.7 kg)    Physical Exam Results for orders placed or performed  in visit on 06/08/18  Lipid panel  Result Value Ref Range   Cholesterol 129 0 - 200 mg/dL   Triglycerides 409.8 0.0 - 149.0 mg/dL   HDL 11.91 (L) >47.82 mg/dL   VLDL 95.6 0.0 - 21.3 mg/dL   LDL Cholesterol 78 0 - 99 mg/dL   Total CHOL/HDL Ratio 5    NonHDL 102.76   Comprehensive metabolic panel  Result Value Ref Range   Sodium 142 135 - 145 mEq/L   Potassium 3.7 3.5 - 5.1 mEq/L   Chloride 104 96 - 112 mEq/L   CO2 32 19 - 32 mEq/L   Glucose, Bld 102 (H) 70 - 99 mg/dL   BUN 13 6 - 23 mg/dL   Creatinine, Ser 0.86 0.40 - 1.50 mg/dL   Total Bilirubin 0.5 0.2 - 1.2 mg/dL   Alkaline Phosphatase 70 39 - 117 U/L   AST 13 0 - 37 U/L   ALT 12 0 - 53 U/L   Total Protein 7.1 6.0 - 8.3 g/dL   Albumin 4.3 3.5 - 5.2 g/dL   Calcium 9.2 8.4 - 57.8 mg/dL   GFR 46.96 >29.52 mL/min  TSH  Result Value Ref Range   TSH 1.15 0.35 - 4.50 uIU/mL  PSA  Result Value Ref Range   PSA 0.80 0.10 - 4.00 ng/mL  CBC with Differential/Platelet  Result Value Ref Range   WBC  13.9 (H) 4.0 - 10.5 K/uL   RBC 4.53 4.22 - 5.81 Mil/uL   Hemoglobin 13.6 13.0 - 17.0 g/dL   HCT 96.0 45.4 - 09.8 %   MCV 89.0 78.0 - 100.0 fl   MCHC 33.7 30.0 - 36.0 g/dL   RDW 11.9 14.7 - 82.9 %   Platelets 386.0 150.0 - 400.0 K/uL   Neutrophils Relative % 72.1 43.0 - 77.0 %   Lymphocytes Relative 20.3 12.0 - 46.0 %   Monocytes Relative 4.7 3.0 - 12.0 %   Eosinophils Relative 1.8 0.0 - 5.0 %   Basophils Relative 1.1 0.0 - 3.0 %   Neutro Abs 10.1 (H) 1.4 - 7.7 K/uL   Lymphs Abs 2.8 0.7 - 4.0 K/uL   Monocytes Absolute 0.6 0.1 - 1.0 K/uL   Eosinophils Absolute 0.2 0.0 - 0.7 K/uL   Basophils Absolute 0.2 (H) 0.0 - 0.1 K/uL      Assessment & Plan:   Problem List Items Addressed This Visit    None       No orders of the defined types were placed in this encounter.  No orders of the defined types were placed in this encounter.   Follow up plan: No follow-ups on file.  Eustaquio Boyden, MD

## 2018-06-24 ENCOUNTER — Encounter: Payer: Self-pay | Admitting: Family Medicine

## 2018-06-24 ENCOUNTER — Ambulatory Visit: Payer: Self-pay | Admitting: Family Medicine

## 2018-06-24 VITALS — BP 140/82 | HR 61 | Temp 98.2°F | Ht 70.0 in | Wt 171.5 lb

## 2018-06-24 DIAGNOSIS — Z23 Encounter for immunization: Secondary | ICD-10-CM

## 2018-06-24 DIAGNOSIS — E785 Hyperlipidemia, unspecified: Secondary | ICD-10-CM

## 2018-06-24 DIAGNOSIS — I10 Essential (primary) hypertension: Secondary | ICD-10-CM

## 2018-06-24 DIAGNOSIS — R634 Abnormal weight loss: Secondary | ICD-10-CM

## 2018-06-24 DIAGNOSIS — Z0000 Encounter for general adult medical examination without abnormal findings: Secondary | ICD-10-CM

## 2018-06-24 LAB — POC URINALSYSI DIPSTICK (AUTOMATED)
Bilirubin, UA: NEGATIVE
Blood, UA: NEGATIVE
GLUCOSE UA: NEGATIVE
Ketones, UA: NEGATIVE
LEUKOCYTES UA: NEGATIVE
Nitrite, UA: NEGATIVE
PROTEIN UA: NEGATIVE
SPEC GRAV UA: 1.01 (ref 1.010–1.025)
Urobilinogen, UA: 0.2 E.U./dL
pH, UA: 8 (ref 5.0–8.0)

## 2018-06-24 MED ORDER — ENALAPRIL MALEATE 20 MG PO TABS: 20.0000 mg | ORAL_TABLET | Freq: Two times a day (BID) | ORAL | 3 refills | Status: DC

## 2018-06-24 MED ORDER — METOPROLOL TARTRATE 25 MG PO TABS: 25.0000 mg | ORAL_TABLET | Freq: Two times a day (BID) | ORAL | 3 refills | Status: DC

## 2018-06-24 MED ORDER — TRIAMTERENE-HCTZ 37.5-25 MG PO TABS: 1.0000 | ORAL_TABLET | Freq: Every day | ORAL | 3 refills | Status: DC

## 2018-06-24 MED ORDER — AMLODIPINE BESYLATE 10 MG PO TABS: 10.0000 mg | ORAL_TABLET | Freq: Every day | ORAL | 3 refills | Status: DC

## 2018-06-24 NOTE — Assessment & Plan Note (Signed)
Chronic, stable. Continue current regimen (5 drug).

## 2018-06-24 NOTE — Progress Notes (Signed)
BP 140/82 (BP Location: Left Arm, Patient Position: Sitting, Cuff Size: Normal)   Pulse 61   Temp 98.2 F (36.8 C) (Oral)   Ht 5\' 10"  (1.778 m)   Wt 171 lb 8 oz (77.8 kg)   SpO2 95%   BMI 24.61 kg/m    CC: CPE Subjective:    Patient ID: Roy Galvan, male    DOB: 1955/11/19, 62 y.o.   MRN: [id number]  HPI: Roy Galvan is a 62 y.o. male presenting on 06/24/2018 for Annual Exam   Overall feeling better, loose stools improving.  Ran out of enalapril last week.   Preventative: COLONOSCOPY Date: 2011 WNL, rec rpt 10 yrs (Dr. Bluford Kaufmann) Prostate cancer screening - father with h/o prostate cancer. Desires yearly screen.  Flu shot yearly  Tetanus 2011  Pneumovax unsure  Shingrix - discussed Seat belt use discussed Sunscreen use discussed. No changing moles on skin Non smoker Alcohol - none Dentist has not seen Eye exam has not seen   Lives with wife, 2 sons and 1 daughter, dogs and cats (rescue)  Occupation: owns [employer] in [place]  Edu: HS  Activity: no regular exercise 2/2 hip pain  Diet: good water, fruits/vegetables daily  Relevant past medical, surgical, family and social history reviewed and updated as indicated. Interim medical history since our last visit reviewed. Allergies and medications reviewed and updated. Outpatient Medications Prior to Visit  Medication Sig Dispense Refill  . aspirin 81 MG tablet Take 81 mg by mouth daily.    Marland Kitchen amLODipine (NORVASC) 10 MG tablet Take 1 tablet (10 mg total) by mouth daily. 90 tablet 0  . enalapril (VASOTEC) 20 MG tablet Take 1 tablet (20 mg total) by mouth 2 (two) times daily. 180 tablet 0  . metoprolol tartrate (LOPRESSOR) 25 MG tablet Take 1 tablet (25 mg total) by mouth 2 (two) times daily. 180 tablet 0  . triamterene-hydrochlorothiazide (MAXZIDE-25) 37.5-25 MG tablet Take 1 tablet by mouth daily. 90 tablet 0   No facility-administered medications prior to visit.      Per HPI unless specifically indicated  in ROS section below Review of Systems  Constitutional: Negative for activity change, appetite change, chills, fatigue, fever and unexpected weight change.  HENT: Negative for hearing loss.   Eyes: Negative for visual disturbance.  Respiratory: Negative for cough, chest tightness, shortness of breath and wheezing.   Cardiovascular: Negative for chest pain, palpitations and leg swelling.  Gastrointestinal: Negative for abdominal distention, abdominal pain, blood in stool, constipation, diarrhea, nausea and vomiting.       No dysphagia  Genitourinary: Negative for difficulty urinating and hematuria.  Musculoskeletal: Negative for arthralgias, myalgias and neck pain.  Skin: Negative for rash.  Neurological: Negative for dizziness, seizures, syncope and headaches.  Hematological: Negative for adenopathy. Does not bruise/bleed easily.  Psychiatric/Behavioral: Negative for dysphoric mood. The patient is not nervous/anxious.        Objective:    BP 140/82 (BP Location: Left Arm, Patient Position: Sitting, Cuff Size: Normal)   Pulse 61   Temp 98.2 F (36.8 C) (Oral)   Ht 5\' 10"  (1.778 m)   Wt 171 lb 8 oz (77.8 kg)   SpO2 95%   BMI 24.61 kg/m   Wt Readings from Last 3 Encounters:  06/24/18 171 lb 8 oz (77.8 kg)  06/08/18 175 lb 12 oz (79.7 kg)  01/26/17 181 lb 8 oz (82.3 kg)    Physical Exam  Constitutional: He is oriented to person,  place, and time. He appears well-developed and well-nourished. No distress.  HENT:  Head: Normocephalic and atraumatic.  Right Ear: Hearing, tympanic membrane, external ear and ear canal normal.  Left Ear: Hearing, tympanic membrane, external ear and ear canal normal.  Nose: Nose normal.  Mouth/Throat: Uvula is midline, oropharynx is clear and moist and mucous membranes are normal. No oropharyngeal exudate, posterior oropharyngeal edema or posterior oropharyngeal erythema.  Eyes: Pupils are equal, round, and reactive to light. Conjunctivae and EOM are  normal. No scleral icterus.  Neck: Normal range of motion. Neck supple.  Cardiovascular: Normal rate, regular rhythm, normal heart sounds and intact distal pulses.  No murmur heard. Pulses:      Radial pulses are 2+ on the right side, and 2+ on the left side.  Pulmonary/Chest: Effort normal and breath sounds normal. No respiratory distress. He has no wheezes. He has no rales.  Abdominal: Soft. Bowel sounds are normal. He exhibits no distension and no mass. There is no tenderness. There is no rebound and no guarding.  Musculoskeletal: Normal range of motion. He exhibits no edema.  Lymphadenopathy:    He has no cervical adenopathy.  Neurological: He is alert and oriented to person, place, and time.  CN grossly intact, station and gait intact  Skin: Skin is warm and dry. No rash noted.  Psychiatric: He has a normal mood and affect. His behavior is normal. Judgment and thought content normal.  Nursing note and vitals reviewed.  Results for orders placed or performed in visit on 06/08/18  Lipid panel  Result Value Ref Range   Cholesterol 129 0 - 200 mg/dL   Triglycerides 098.1 0.0 - 149.0 mg/dL   HDL 19.14 (L) >78.29 mg/dL   VLDL 56.2 0.0 - 13.0 mg/dL   LDL Cholesterol 78 0 - 99 mg/dL   Total CHOL/HDL Ratio 5    NonHDL 102.76   Comprehensive metabolic panel  Result Value Ref Range   Sodium 142 135 - 145 mEq/L   Potassium 3.7 3.5 - 5.1 mEq/L   Chloride 104 96 - 112 mEq/L   CO2 32 19 - 32 mEq/L   Glucose, Bld 102 (H) 70 - 99 mg/dL   BUN 13 6 - 23 mg/dL   Creatinine, Ser 8.65 0.40 - 1.50 mg/dL   Total Bilirubin 0.5 0.2 - 1.2 mg/dL   Alkaline Phosphatase 70 39 - 117 U/L   AST 13 0 - 37 U/L   ALT 12 0 - 53 U/L   Total Protein 7.1 6.0 - 8.3 g/dL   Albumin 4.3 3.5 - 5.2 g/dL   Calcium 9.2 8.4 - 78.4 mg/dL   GFR 69.62 >95.28 mL/min  TSH  Result Value Ref Range   TSH 1.15 0.35 - 4.50 uIU/mL  PSA  Result Value Ref Range   PSA 0.80 0.10 - 4.00 ng/mL  CBC with Differential/Platelet    Result Value Ref Range   WBC 13.9 (H) 4.0 - 10.5 K/uL   RBC 4.53 4.22 - 5.81 Mil/uL   Hemoglobin 13.6 13.0 - 17.0 g/dL   HCT 41.3 24.4 - 01.0 %   MCV 89.0 78.0 - 100.0 fl   MCHC 33.7 30.0 - 36.0 g/dL   RDW 27.2 53.6 - 64.4 %   Platelets 386.0 150.0 - 400.0 K/uL   Neutrophils Relative % 72.1 43.0 - 77.0 %   Lymphocytes Relative 20.3 12.0 - 46.0 %   Monocytes Relative 4.7 3.0 - 12.0 %   Eosinophils Relative 1.8 0.0 - 5.0 %  Basophils Relative 1.1 0.0 - 3.0 %   Neutro Abs 10.1 (H) 1.4 - 7.7 K/uL   Lymphs Abs 2.8 0.7 - 4.0 K/uL   Monocytes Absolute 0.6 0.1 - 1.0 K/uL   Eosinophils Absolute 0.2 0.0 - 0.7 K/uL   Basophils Absolute 0.2 (H) 0.0 - 0.1 K/uL      Assessment & Plan:   Problem List Items Addressed This Visit    Weight loss    30 lbs in last 4 yrs, not trying. 10 lbs in last year.  Non smoker. UTD preventative health care. Labs stable.  Check UA today.  Advised to start monitoring weight more closely.  If ongoing weight loss, to let me know - would consider abd imaging ([place]) and CXR.       Relevant Orders   POCT Urinalysis Dipstick (Automated)   HTN (hypertension)    Chronic, stable. Continue current regimen (5 drug).       Relevant Medications   amLODipine (NORVASC) 10 MG tablet   enalapril (VASOTEC) 20 MG tablet   metoprolol tartrate (LOPRESSOR) 25 MG tablet   triamterene-hydrochlorothiazide (MAXZIDE-25) 37.5-25 MG tablet   Healthcare maintenance - Primary    Preventative protocols reviewed and updated unless pt declined. Discussed healthy diet and lifestyle.       Dyslipidemia    Chronic, not on statin. Improved control with diet changes and weight loss.  The ASCVD Risk score Roy George DC Jr., Roy al., Roy Galvan) failed to calculate for the following reasons:   The valid total cholesterol range is 130 to 320 mg/dL        Other Visit Diagnoses    Need for influenza vaccination       Relevant Orders   Flu Vaccine QUAD 36+ mos IM (Completed)       Meds ordered  this encounter  Medications  . amLODipine (NORVASC) 10 MG tablet    Sig: Take 1 tablet (10 mg total) by mouth daily.    Dispense:  90 tablet    Refill:  3  . enalapril (VASOTEC) 20 MG tablet    Sig: Take 1 tablet (20 mg total) by mouth 2 (two) times daily.    Dispense:  180 tablet    Refill:  3  . metoprolol tartrate (LOPRESSOR) 25 MG tablet    Sig: Take 1 tablet (25 mg total) by mouth 2 (two) times daily.    Dispense:  180 tablet    Refill:  3  . triamterene-hydrochlorothiazide (MAXZIDE-25) 37.5-25 MG tablet    Sig: Take 1 tablet by mouth daily.    Dispense:  90 tablet    Refill:  3   Orders Placed This Encounter  Procedures  . Flu Vaccine QUAD 36+ mos IM  . POCT Urinalysis Dipstick (Automated)    Follow up plan: Return in about 1 year (around 06/25/2019) for annual exam, prior fasting for blood work.  Eustaquio Boyden, MD

## 2018-06-24 NOTE — Assessment & Plan Note (Signed)
Chronic, not on statin. Improved control with diet changes and weight loss.  The ASCVD Risk score Denman George DC Jr., et al., 2013) failed to calculate for the following reasons:   The valid total cholesterol range is 130 to 320 mg/dL

## 2018-06-24 NOTE — Assessment & Plan Note (Addendum)
30 lbs in last 4 yrs, not trying. 10 lbs in last year.  Non smoker. UTD preventative health care. Labs stable.  Check UA today.  Advised to start monitoring weight more closely.  If ongoing weight loss, to let me know - would consider abd imaging ([place]) and CXR.

## 2018-06-24 NOTE — Assessment & Plan Note (Signed)
Preventative protocols reviewed and updated unless pt declined. Discussed healthy diet and lifestyle.  

## 2018-06-24 NOTE — Patient Instructions (Addendum)
Flu shot today Urinalysis today. If interested, check with pharmacy about new 2 shot shingles series (shingrix).  Keep an eye on weight - let me know if ongoing weight loss.   Health Maintenance, Male A healthy lifestyle and preventive care is important for your health and wellness. Ask your health care provider about what schedule of regular examinations is right for you. What should I know about weight and diet? Eat a Healthy Diet  Eat plenty of vegetables, fruits, whole grains, low-fat dairy products, and lean protein.  Do not eat a lot of foods high in solid fats, added sugars, or salt.  Maintain a Healthy Weight Regular exercise can help you achieve or maintain a healthy weight. You should:  Do at least 150 minutes of exercise each week. The exercise should increase your heart rate and make you sweat (moderate-intensity exercise).  Do strength-training exercises at least twice a week.  Watch Your Levels of Cholesterol and Blood Lipids  Have your blood tested for lipids and cholesterol every 5 years starting at 60 years of age. If you are at high risk for heart disease, you should start having your blood tested when you are 60 years old. You may need to have your cholesterol levels checked more often if: ? Your lipid or cholesterol levels are high. ? You are older than 60 years of age. ? You are at high risk for heart disease.  What should I know about cancer screening? Many types of cancers can be detected early and may often be prevented. Lung Cancer  You should be screened every year for lung cancer if: ? You are a current smoker who has smoked for at least 30 years. ? You are a former smoker who has quit within the past 15 years.  Talk to your health care provider about your screening options, when you should start screening, and how often you should be screened.  Colorectal Cancer  Routine colorectal cancer screening usually begins at 60 years of age and should be  repeated every 5-10 years until you are 60 years old. You may need to be screened more often if early forms of precancerous polyps or small growths are found. Your health care provider may recommend screening at an earlier age if you have risk factors for colon cancer.  Your health care provider may recommend using home test kits to check for hidden blood in the stool.  A small camera at the end of a tube can be used to examine your colon (sigmoidoscopy or colonoscopy). This checks for the earliest forms of colorectal cancer.  Prostate and Testicular Cancer  Depending on your age and overall health, your health care provider may do certain tests to screen for prostate and testicular cancer.  Talk to your health care provider about any symptoms or concerns you have about testicular or prostate cancer.  Skin Cancer  Check your skin from head to toe regularly.  Tell your health care provider about any new moles or changes in moles, especially if: ? There is a change in a mole's size, shape, or color. ? You have a mole that is larger than a pencil eraser.  Always use sunscreen. Apply sunscreen liberally and repeat throughout the day.  Protect yourself by wearing long sleeves, pants, a wide-brimmed hat, and sunglasses when outside.  What should I know about heart disease, diabetes, and high blood pressure?  If you are 71-54 years of age, have your blood pressure checked every 3-5 years. If you  are 53 years of age or older, have your blood pressure checked every year. You should have your blood pressure measured twice-once when you are at a hospital or clinic, and once when you are not at a hospital or clinic. Record the average of the two measurements. To check your blood pressure when you are not at a hospital or clinic, you can use: ? An automated blood pressure machine at a pharmacy. ? A home blood pressure monitor.  Talk to your health care provider about your target blood  pressure.  If you are between 19-34 years old, ask your health care provider if you should take aspirin to prevent heart disease.  Have regular diabetes screenings by checking your fasting blood sugar level. ? If you are at a normal weight and have a low risk for diabetes, have this test once every three years after the age of 37. ? If you are overweight and have a high risk for diabetes, consider being tested at a younger age or more often.  A one-time screening for abdominal aortic aneurysm (AAA) by ultrasound is recommended for men aged 79-75 years who are current or former smokers. What should I know about preventing infection? Hepatitis B If you have a higher risk for hepatitis B, you should be screened for this virus. Talk with your health care provider to find out if you are at risk for hepatitis B infection. Hepatitis C Blood testing is recommended for:  Everyone born from [birth year] through 1963.  Anyone with known risk factors for hepatitis C.  Sexually Transmitted Diseases (STDs)  You should be screened each year for STDs including gonorrhea and chlamydia if: ? You are sexually active and are younger than 60 years of age. ? You are older than 60 years of age and your health care provider tells you that you are at risk for this type of infection. ? Your sexual activity has changed since you were last screened and you are at an increased risk for chlamydia or gonorrhea. Ask your health care provider if you are at risk.  Talk with your health care provider about whether you are at high risk of being infected with HIV. Your health care provider may recommend a prescription medicine to help prevent HIV infection.  What else can I do?  Schedule regular health, dental, and eye exams.  Stay current with your vaccines (immunizations).  Do not use any tobacco products, such as cigarettes, chewing tobacco, and e-cigarettes. If you need help quitting, ask your health care  provider.  Limit alcohol intake to no more than 2 drinks per day. One drink equals 12 ounces of beer, 5 ounces of wine, or 1 ounces of hard liquor.  Do not use street drugs.  Do not share needles.  Ask your health care provider for help if you need support or information about quitting drugs.  Tell your health care provider if you often feel depressed.  Tell your health care provider if you have ever been abused or do not feel safe at home. This information is not intended to replace advice given to you by your health care provider. Make sure you discuss any questions you have with your health care provider. Document Released: 03/05/2008 Document Revised: 05/06/2016 Document Reviewed: 06/11/2015 Elsevier Interactive Patient Education  [employer].

## 2018-12-21 DIAGNOSIS — M1612 Unilateral primary osteoarthritis, left hip: Secondary | ICD-10-CM | POA: Diagnosis not present

## 2018-12-21 DIAGNOSIS — M25511 Pain in right shoulder: Secondary | ICD-10-CM | POA: Diagnosis not present

## 2018-12-21 DIAGNOSIS — M160 Bilateral primary osteoarthritis of hip: Secondary | ICD-10-CM | POA: Diagnosis not present

## 2019-06-26 ENCOUNTER — Other Ambulatory Visit: Payer: Self-pay | Admitting: Family Medicine

## 2019-06-26 ENCOUNTER — Other Ambulatory Visit: Payer: Self-pay

## 2019-06-26 ENCOUNTER — Other Ambulatory Visit (INDEPENDENT_AMBULATORY_CARE_PROVIDER_SITE_OTHER): Payer: Self-pay

## 2019-06-26 DIAGNOSIS — Z125 Encounter for screening for malignant neoplasm of prostate: Secondary | ICD-10-CM

## 2019-06-26 DIAGNOSIS — E785 Hyperlipidemia, unspecified: Secondary | ICD-10-CM

## 2019-06-26 LAB — PSA: PSA: 0.86 ng/mL (ref 0.10–4.00)

## 2019-06-26 LAB — COMPREHENSIVE METABOLIC PANEL
ALT: 11 U/L (ref 0–53)
AST: 12 U/L (ref 0–37)
Albumin: 4.2 g/dL (ref 3.5–5.2)
Alkaline Phosphatase: 64 U/L (ref 39–117)
BUN: 18 mg/dL (ref 6–23)
CO2: 31 mEq/L (ref 19–32)
Calcium: 9.2 mg/dL (ref 8.4–10.5)
Chloride: 103 mEq/L (ref 96–112)
Creatinine, Ser: 0.96 mg/dL (ref 0.40–1.50)
GFR: 79.48 mL/min (ref >60.00)
Glucose, Bld: 98 mg/dL (ref 70–99)
Potassium: 3.6 mEq/L (ref 3.5–5.1)
Sodium: 143 mEq/L (ref 135–145)
Total Bilirubin: 0.5 mg/dL (ref 0.2–1.2)
Total Protein: 6.9 g/dL (ref 6.0–8.3)

## 2019-06-26 LAB — LIPID PANEL
Cholesterol: 179 mg/dL (ref 0–200)
HDL: 38.2 mg/dL — ABNORMAL LOW (ref >39.00)
LDL Cholesterol: 126 mg/dL — ABNORMAL HIGH (ref 0–99)
NonHDL: 140.98
Total CHOL/HDL Ratio: 5
Triglycerides: 73 mg/dL (ref 0.0–149.0)
VLDL: 14.6 mg/dL (ref 0.0–40.0)

## 2019-06-29 ENCOUNTER — Encounter: Payer: Self-pay | Admitting: Family Medicine

## 2019-06-29 ENCOUNTER — Other Ambulatory Visit: Payer: Self-pay

## 2019-06-29 ENCOUNTER — Ambulatory Visit (INDEPENDENT_AMBULATORY_CARE_PROVIDER_SITE_OTHER): Payer: BC Managed Care – PPO | Admitting: Family Medicine

## 2019-06-29 VITALS — BP 168/110 | HR 69 | Temp 97.9°F | Ht 70.0 in | Wt 185.5 lb

## 2019-06-29 DIAGNOSIS — E785 Hyperlipidemia, unspecified: Secondary | ICD-10-CM | POA: Diagnosis not present

## 2019-06-29 DIAGNOSIS — Z23 Encounter for immunization: Secondary | ICD-10-CM

## 2019-06-29 DIAGNOSIS — M25651 Stiffness of right hip, not elsewhere classified: Secondary | ICD-10-CM | POA: Diagnosis not present

## 2019-06-29 DIAGNOSIS — I10 Essential (primary) hypertension: Secondary | ICD-10-CM | POA: Diagnosis not present

## 2019-06-29 DIAGNOSIS — Z0000 Encounter for general adult medical examination without abnormal findings: Secondary | ICD-10-CM

## 2019-06-29 DIAGNOSIS — M25652 Stiffness of left hip, not elsewhere classified: Secondary | ICD-10-CM

## 2019-06-29 MED ORDER — METOPROLOL SUCCINATE ER 50 MG PO TB24: 50.0000 mg | ORAL_TABLET | Freq: Every day | ORAL | 3 refills | Status: DC

## 2019-06-29 MED ORDER — ENALAPRIL MALEATE 20 MG PO TABS: 20.0000 mg | ORAL_TABLET | Freq: Two times a day (BID) | ORAL | 3 refills | Status: DC

## 2019-06-29 MED ORDER — TRIAMTERENE-HCTZ 37.5-25 MG PO TABS: 1.0000 | ORAL_TABLET | Freq: Every day | ORAL | 3 refills | Status: DC

## 2019-06-29 MED ORDER — AMLODIPINE BESYLATE 10 MG PO TABS: 10.0000 mg | ORAL_TABLET | Freq: Every day | ORAL | 3 refills | Status: DC

## 2019-06-29 NOTE — Patient Instructions (Addendum)
Flu shot today Baseline EKG today.  Consider new 2 shot shingles series (shingrix). Let us know if you'd like to receive.  Consider dentist and eye exam.  Switch metoprolol to extended release formulation (once a day) - sent to pharmacy. Continue other medicines.  Goal blood pressure is <140/90.  Return as needed or in 3 months for blood pressure follow up visit.   Health Maintenance, Male Adopting a healthy lifestyle and getting preventive care are important in promoting health and wellness. Ask your health care provider about:  The right schedule for you to have regular tests and exams.  Things you can do on your own to prevent diseases and keep yourself healthy. What should I know about diet, weight, and exercise? Eat a healthy diet   Eat a diet that includes plenty of vegetables, fruits, low-fat dairy products, and lean protein.  Do not eat a lot of foods that are high in solid fats, added sugars, or sodium. Maintain a healthy weight Body mass index (BMI) is a measurement that can be used to identify possible weight problems. It estimates body fat based on height and weight. Your health care provider can help determine your BMI and help you achieve or maintain a healthy weight. Get regular exercise Get regular exercise. This is one of the most important things you can do for your health. Most adults should:  Exercise for at least 150 minutes each week. The exercise should increase your heart rate and make you sweat (moderate-intensity exercise).  Do strengthening exercises at least twice a week. This is in addition to the moderate-intensity exercise.  Spend less time sitting. Even light physical activity can be beneficial. Watch cholesterol and blood lipids Have your blood tested for lipids and cholesterol at 61 years of age, then have this test every 5 years. You may need to have your cholesterol levels checked more often if:  Your lipid or cholesterol levels are high.  You  are older than 61 years of age.  You are at high risk for heart disease. What should I know about cancer screening? Many types of cancers can be detected early and may often be prevented. Depending on your health history and family history, you may need to have cancer screening at various ages. This may include screening for:  Colorectal cancer.  Prostate cancer.  Skin cancer.  Lung cancer. What should I know about heart disease, diabetes, and high blood pressure? Blood pressure and heart disease  High blood pressure causes heart disease and increases the risk of stroke. This is more likely to develop in people who have high blood pressure readings, are of African descent, or are overweight.  Talk with your health care provider about your target blood pressure readings.  Have your blood pressure checked: ? Every 3-5 years if you are 35-92 years of age. ? Every year if you are 62 years old or older.  If you are between the ages of 70 and 33 and are a current or former smoker, ask your health care provider if you should have a one-time screening for abdominal aortic aneurysm (AAA). Diabetes Have regular diabetes screenings. This checks your fasting blood sugar level. Have the screening done:  Once every three years after age 33 if you are at a normal weight and have a low risk for diabetes.  More often and at a younger age if you are overweight or have a high risk for diabetes. What should I know about preventing infection? Hepatitis B If  you have a higher risk for hepatitis B, you should be screened for this virus. Talk with your health care provider to find out if you are at risk for hepatitis B infection. Hepatitis C Blood testing is recommended for:  Everyone born from [birth year] through 1963.  Anyone with known risk factors for hepatitis C. Sexually transmitted infections (STIs)  You should be screened each year for STIs, including gonorrhea and chlamydia, if: ? You are  sexually active and are younger than 61 years of age. ? You are older than 61 years of age and your health care provider tells you that you are at risk for this type of infection. ? Your sexual activity has changed since you were last screened, and you are at increased risk for chlamydia or gonorrhea. Ask your health care provider if you are at risk.  Ask your health care provider about whether you are at high risk for HIV. Your health care provider may recommend a prescription medicine to help prevent HIV infection. If you choose to take medicine to prevent HIV, you should first get tested for HIV. You should then be tested every 3 months for as long as you are taking the medicine. Follow these instructions at home: Lifestyle  Do not use any products that contain nicotine or tobacco, such as cigarettes, e-cigarettes, and chewing tobacco. If you need help quitting, ask your health care provider.  Do not use street drugs.  Do not share needles.  Ask your health care provider for help if you need support or information about quitting drugs. Alcohol use  Do not drink alcohol if your health care provider tells you not to drink.  If you drink alcohol: ? Limit how much you have to 0-2 drinks a day. ? Be aware of how much alcohol is in your drink. In the [place] one drink equals one 12 oz bottle of beer (355 mL), one 5 oz glass of wine (148 mL), or one 1 oz glass of hard liquor (44 mL). General instructions  Schedule regular health, dental, and eye exams.  Stay current with your vaccines.  Tell your health care provider if: ? You often feel depressed. ? You have ever been abused or do not feel safe at home. Summary  Adopting a healthy lifestyle and getting preventive care are important in promoting health and wellness.  Follow your health care provider's instructions about healthy diet, exercising, and getting tested or screened for diseases.  Follow your health care provider's  instructions on monitoring your cholesterol and blood pressure. This information is not intended to replace advice given to you by your health care provider. Make sure you discuss any questions you have with your health care provider. Document Released: 03/05/2008 Document Revised: 08/31/2018 Document Reviewed: 08/31/2018 Elsevier Patient Education  2020 [employer].

## 2019-06-29 NOTE — Assessment & Plan Note (Addendum)
Chronic, deteriorated. He has been taking BID meds only once. Will change metoprolol to succinate 50mg  once daily. Discussed enalapril use, did recommend BID use. Advised start monitoring blood pressures more closely at home. RTC 3 mo close HTN f/u. Baseline EKG today - low voltage throughout except precordials - ?lead placement. As asymptomatic, will repeat EKG next visit hopeful for better BP control.

## 2019-06-29 NOTE — Assessment & Plan Note (Addendum)
Chronic, not on statin. Worsened readings however overall stable. Consider statin in the future. He is on aspirin, non smoker.  The 10-year ASCVD risk score Mikey Bussing DC Brooke Bonito., et al., 2013) is: 32.8%   Values used to calculate the score:     Age: 63 years     Sex: Male     Is Non-Hispanic African American: No     Diabetic: Yes     Tobacco smoker: No     Systolic Blood Pressure: 625 mmHg     Is BP treated: Yes     HDL Cholesterol: 38.2 mg/dL     Total Cholesterol: 179 mg/dL

## 2019-06-29 NOTE — Assessment & Plan Note (Signed)
Preventative protocols reviewed and updated unless pt declined. Discussed healthy diet and lifestyle.  

## 2019-06-29 NOTE — Assessment & Plan Note (Signed)
Saw ortho - considering surgery 2021

## 2019-06-29 NOTE — Progress Notes (Signed)
This visit was conducted in person.  BP (!) 168/110 (BP Location: Right Arm, Cuff Size: Normal)   Pulse 69   Temp 97.9 F (36.6 C) (Temporal)   Ht 5\' 10"  (1.778 m)   Wt 185 lb 8 oz (84.1 kg)   SpO2 95%   BMI 26.62 kg/m    CC: CPE Subjective:    Patient ID: Roy Galvan, male    DOB: 09/09/1956, 63 y.o.   MRN: [id number]  HPI: Roy Galvan is a 63 y.o. male presenting on 06/29/2019 for Annual Exam   Weight gain noted.  BP elevated - he had stressful morning. Compliant with current regimen although he mentions he takes BID meds only once. Has BP cuff but doesn't check regularly.   Preventative: COLONOSCOPY Date: 2011 WNL, rec rpt 10 yrs (Dr. Candace Cruise) Prostate cancer screening - father with h/o prostate cancer. Desires yearly screen.  Flu shot yearly  Tetanus 2011, Tdap 04/2019 Pneumovaxunsure Shingrix - discussed - will consider.  Seat belt usediscussed Sunscreen use discussed. 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 Interim medical history since our last visit reviewed. Allergies and medications reviewed and updated. Outpatient Medications Prior to Visit  Medication Sig Dispense Refill  . aspirin 81 MG tablet Take 81 mg by mouth daily.    . metoprolol tartrate (LOPRESSOR) 25 MG tablet Take 1 tablet (25 mg total) by mouth 2 (two) times daily. 180 tablet 3  . amLODipine (NORVASC) 10 MG tablet Take 1 tablet (10 mg total) by mouth daily. 90 tablet 3  . enalapril (VASOTEC) 20 MG tablet Take 1 tablet (20 mg total) by mouth 2 (two) times daily. 180 tablet 3  . triamterene-hydrochlorothiazide (MAXZIDE-25)  37.5-25 MG tablet Take 1 tablet by mouth daily. 90 tablet 3   No facility-administered medications prior to visit.      Per HPI unless specifically indicated in ROS section below Review of Systems  Constitutional: Negative for activity change, appetite change, chills, fatigue, fever and unexpected weight change.  HENT: Negative for hearing loss.   Eyes: Negative for visual disturbance.  Respiratory: Negative for cough, chest tightness, shortness of breath and wheezing.   Cardiovascular: Negative for chest pain, palpitations and leg swelling.  Gastrointestinal: Negative for abdominal distention, abdominal pain, blood in stool, constipation, diarrhea, nausea and vomiting.  Genitourinary: Negative for difficulty urinating and hematuria.  Musculoskeletal: Negative for arthralgias, myalgias and neck pain.  Skin: Negative for rash.  Neurological: Negative for dizziness, seizures, syncope and headaches.  Hematological: Negative for adenopathy. Does not bruise/bleed easily.  Psychiatric/Behavioral: Negative for dysphoric mood. The patient is not nervous/anxious.    Objective:    BP (!) 168/110 (BP Location: Right Arm, Cuff Size: Normal)   Pulse 69   Temp 97.9 F (36.6 C) (Temporal)   Ht 5\' 10"  (1.778 m)   Wt 185 lb 8 oz (84.1 kg)   SpO2 95%   BMI 26.62 kg/m   Wt Readings from Last 3 Encounters:  06/29/19 185 lb 8 oz (84.1 kg)  06/24/18 171 lb 8 oz (77.8 kg)  06/08/18 175 lb  12 oz (79.7 kg)    Physical Exam Vitals signs and nursing note reviewed.  Constitutional:      General: He is not in acute distress.    Appearance: Normal appearance. He is well-developed. He is not ill-appearing.  HENT:     Head: Normocephalic and atraumatic.     Right Ear: Hearing, tympanic membrane, ear canal and external ear normal.     Left Ear: Hearing, tympanic membrane, ear canal and external ear normal.     Nose: Nose normal.     Mouth/Throat:     Mouth: Mucous membranes are moist.     Pharynx:  Oropharynx is clear. Uvula midline. No posterior oropharyngeal erythema.  Eyes:     General: No scleral icterus.    Extraocular Movements: Extraocular movements intact.     Conjunctiva/sclera: Conjunctivae normal.     Pupils: Pupils are equal, round, and reactive to light.  Neck:     Musculoskeletal: Normal range of motion and neck supple.  Cardiovascular:     Rate and Rhythm: Normal rate and regular rhythm.     Pulses: Normal pulses.          Radial pulses are 2+ on the right side and 2+ on the left side.     Heart sounds: Normal heart sounds. No murmur.  Pulmonary:     Effort: Pulmonary effort is normal. No respiratory distress.     Breath sounds: Normal breath sounds. No wheezing, rhonchi or rales.  Abdominal:     General: Abdomen is flat. Bowel sounds are normal. There is no distension.     Palpations: Abdomen is soft. There is no mass.     Tenderness: There is no abdominal tenderness. There is no guarding or rebound.     Hernia: No hernia is present.  Musculoskeletal: Normal range of motion.     Right lower leg: No edema.     Left lower leg: No edema.  Lymphadenopathy:     Cervical: No cervical adenopathy.  Skin:    General: Skin is warm and dry.     Findings: No rash.  Neurological:     General: No focal deficit present.     Mental Status: He is alert and oriented to person, place, and time.     Comments: CN grossly intact, station and gait intact  Psychiatric:        Mood and Affect: Mood normal.        Behavior: Behavior normal.        Thought Content: Thought content normal.        Judgment: Judgment normal.       Results for orders placed or performed in visit on 06/26/19  PSA  Result Value Ref Range   PSA 0.86 0.10 - 4.00 ng/mL  Comprehensive metabolic panel  Result Value Ref Range   Sodium 143 135 - 145 mEq/L   Potassium 3.6 3.5 - 5.1 mEq/L   Chloride 103 96 - 112 mEq/L   CO2 31 19 - 32 mEq/L   Glucose, Bld 98 70 - 99 mg/dL   BUN 18 6 - 23 mg/dL    Creatinine, Ser 3.66 0.40 - 1.50 mg/dL   Total Bilirubin 0.5 0.2 - 1.2 mg/dL   Alkaline Phosphatase 64 39 - 117 U/L   AST 12 0 - 37 U/L   ALT 11 0 - 53 U/L   Total Protein 6.9 6.0 - 8.3 g/dL   Albumin 4.2 3.5 - 5.2 g/dL   Calcium 9.2 8.4 -  10.5 mg/dL   GFR 16.1079.48 >96.04>60.00 mL/min  Lipid panel  Result Value Ref Range   Cholesterol 179 0 - 200 mg/dL   Triglycerides 54.073.0 0.0 - 149.0 mg/dL   HDL 98.1138.20 (L) >91.47>39.00 mg/dL   VLDL 82.914.6 0.0 - 56.240.0 mg/dL   LDL Cholesterol 130126 (H) 0 - 99 mg/dL   Total CHOL/HDL Ratio 5    NonHDL 140.98    EKG - NSR rate 60, normal axis, intervals, no acute ST/T changes. Low voltage throughout except precordials Assessment & Plan:   Problem List Items Addressed This Visit    HTN (hypertension)    Chronic, deteriorated. He has been taking BID meds only once. Will change metoprolol to succinate 50mg  once daily. Discussed enalapril use, did recommend BID use. Advised start monitoring blood pressures more closely at home. RTC 3 mo close HTN f/u. Baseline EKG today - low voltage throughout except precordials - ?lead placement. As asymptomatic, will repeat EKG next visit hopeful for better BP control.       Relevant Medications   amLODipine (NORVASC) 10 MG tablet   enalapril (VASOTEC) 20 MG tablet   triamterene-hydrochlorothiazide (MAXZIDE-25) 37.5-25 MG tablet   metoprolol succinate (TOPROL-XL) 50 MG 24 hr tablet   Other Relevant Orders   EKG 12-Lead (Completed)   Hip joint stiffness, bilateral    Saw ortho - considering surgery 2021      Healthcare maintenance - Primary    Preventative protocols reviewed and updated unless pt declined. Discussed healthy diet and lifestyle.       Dyslipidemia    Chronic, not on statin. Worsened readings however overall stable. Consider statin in the future. He is on aspirin, non smoker.  The 10-year ASCVD risk score Denman George(Goff DC Montez HagemanJr., et al., 2013) is: 32.8%   Values used to calculate the score:     Age: [age] years     Sex: Male      Is Non-Hispanic African American: No     Diabetic: Yes     Tobacco smoker: No     Systolic Blood Pressure: 168 mmHg     Is BP treated: Yes     HDL Cholesterol: 38.2 mg/dL     Total Cholesterol: 179 mg/dL         Other Visit Diagnoses    Need for influenza vaccination       Relevant Orders   Flu Vaccine QUAD 36+ mos IM (Completed)       Meds ordered this encounter  Medications  . amLODipine (NORVASC) 10 MG tablet    Sig: Take 1 tablet (10 mg total) by mouth daily.    Dispense:  90 tablet    Refill:  3  . enalapril (VASOTEC) 20 MG tablet    Sig: Take 1 tablet (20 mg total) by mouth 2 (two) times daily.    Dispense:  180 tablet    Refill:  3  . triamterene-hydrochlorothiazide (MAXZIDE-25) 37.5-25 MG tablet    Sig: Take 1 tablet by mouth daily.    Dispense:  90 tablet    Refill:  3  . metoprolol succinate (TOPROL-XL) 50 MG 24 hr tablet    Sig: Take 1 tablet (50 mg total) by mouth daily. Take with or immediately following a meal.    Dispense:  90 tablet    Refill:  3   Orders Placed This Encounter  Procedures  . Flu Vaccine QUAD 36+ mos IM  . EKG 12-Lead    Patient instructions: Flu shot today Baseline EKG  today.  Consider new 2 shot shingles series (shingrix). Let us know if you'd like to receive.  Consider dentist and eye exam.  Switch metoprolol to extended release formulation (once a day) - sent to pharmacy. Continue other medicines.  Goal blood pressure is <140/90.  Return as needed or in 3 months for blood pressure follow up visit.   Follow up plan: Return in about 3 months (around 09/29/2019) for follow up visit.  Eustaquio Boyden, MD

## 2019-08-22 DIAGNOSIS — Z8616 Personal history of COVID-19: Secondary | ICD-10-CM

## 2019-08-22 HISTORY — DX: Personal history of COVID-19: Z86.16

## 2019-09-28 ENCOUNTER — Ambulatory Visit: Payer: BC Managed Care – PPO | Admitting: Family Medicine

## 2019-12-18 DIAGNOSIS — M25511 Pain in right shoulder: Secondary | ICD-10-CM | POA: Diagnosis not present

## 2019-12-18 DIAGNOSIS — M1611 Unilateral primary osteoarthritis, right hip: Secondary | ICD-10-CM | POA: Diagnosis not present

## 2019-12-18 DIAGNOSIS — M1612 Unilateral primary osteoarthritis, left hip: Secondary | ICD-10-CM | POA: Diagnosis not present

## 2020-02-02 ENCOUNTER — Other Ambulatory Visit: Payer: Self-pay | Admitting: Orthopedic Surgery

## 2020-02-02 DIAGNOSIS — M19011 Primary osteoarthritis, right shoulder: Secondary | ICD-10-CM | POA: Diagnosis not present

## 2020-02-02 DIAGNOSIS — M25511 Pain in right shoulder: Secondary | ICD-10-CM

## 2020-02-05 ENCOUNTER — Other Ambulatory Visit: Payer: Self-pay

## 2020-02-05 ENCOUNTER — Ambulatory Visit
Admission: RE | Admit: 2020-02-05 | Discharge: 2020-02-05 | Disposition: A | Discharge status: Home / Self Care | Payer: BC Managed Care – PPO | Source: Ambulatory Visit | Attending: Orthopedic Surgery | Admitting: Orthopedic Surgery

## 2020-02-05 DIAGNOSIS — M25511 Pain in right shoulder: Secondary | ICD-10-CM

## 2020-02-05 DIAGNOSIS — M19011 Primary osteoarthritis, right shoulder: Secondary | ICD-10-CM | POA: Diagnosis not present

## 2020-02-07 ENCOUNTER — Telehealth: Payer: Self-pay

## 2020-02-07 NOTE — Telephone Encounter (Signed)
Received faxed Surgical Clearance form from [facility].  Placed form in Dr. Timoteo Expose box.    Spoke with pt scheduling pre-op OV on 02/09/20 at 12:00.

## 2020-02-09 ENCOUNTER — Ambulatory Visit (INDEPENDENT_AMBULATORY_CARE_PROVIDER_SITE_OTHER): Payer: BC Managed Care – PPO | Admitting: Family Medicine

## 2020-02-09 ENCOUNTER — Encounter: Payer: Self-pay | Admitting: Family Medicine

## 2020-02-09 ENCOUNTER — Ambulatory Visit (INDEPENDENT_AMBULATORY_CARE_PROVIDER_SITE_OTHER)
Admission: RE | Admit: 2020-02-09 | Discharge: 2020-02-09 | Disposition: A | Discharge status: Home / Self Care | Payer: BC Managed Care – PPO | Source: Ambulatory Visit | Attending: Family Medicine | Admitting: Family Medicine

## 2020-02-09 ENCOUNTER — Other Ambulatory Visit: Payer: Self-pay

## 2020-02-09 VITALS — BP 156/94 | HR 73 | Temp 97.9°F | Ht 70.0 in | Wt 198.4 lb

## 2020-02-09 DIAGNOSIS — M25511 Pain in right shoulder: Secondary | ICD-10-CM | POA: Insufficient documentation

## 2020-02-09 DIAGNOSIS — Z01818 Encounter for other preprocedural examination: Secondary | ICD-10-CM

## 2020-02-09 DIAGNOSIS — I10 Essential (primary) hypertension: Secondary | ICD-10-CM | POA: Diagnosis not present

## 2020-02-09 DIAGNOSIS — G8929 Other chronic pain: Secondary | ICD-10-CM | POA: Diagnosis not present

## 2020-02-09 LAB — COMPREHENSIVE METABOLIC PANEL
ALT: 18 U/L (ref 0–53)
AST: 19 U/L (ref 0–37)
Albumin: 4.6 g/dL (ref 3.5–5.2)
Alkaline Phosphatase: 77 U/L (ref 39–117)
BUN: 20 mg/dL (ref 6–23)
CO2: 32 mEq/L (ref 19–32)
Calcium: 9.6 mg/dL (ref 8.4–10.5)
Chloride: 100 mEq/L (ref 96–112)
Creatinine, Ser: 1.11 mg/dL (ref 0.40–1.50)
GFR: 67.09 mL/min (ref >60.00)
Glucose, Bld: 101 mg/dL — ABNORMAL HIGH (ref 70–99)
Potassium: 3.4 mEq/L — ABNORMAL LOW (ref 3.5–5.1)
Sodium: 138 mEq/L (ref 135–145)
Total Bilirubin: 0.8 mg/dL (ref 0.2–1.2)
Total Protein: 7.7 g/dL (ref 6.0–8.3)

## 2020-02-09 LAB — CBC WITH DIFFERENTIAL/PLATELET
Basophils Absolute: 0.1 10*3/uL (ref 0.0–0.1)
Basophils Relative: 0.9 % (ref 0.0–3.0)
Eosinophils Absolute: 0.3 10*3/uL (ref 0.0–0.7)
Eosinophils Relative: 2.3 % (ref 0.0–5.0)
HCT: 42.8 % (ref 39.0–52.0)
Hemoglobin: 14.4 g/dL (ref 13.0–17.0)
Lymphocytes Relative: 26.9 % (ref 12.0–46.0)
Lymphs Abs: 3.2 10*3/uL (ref 0.7–4.0)
MCHC: 33.6 g/dL (ref 30.0–36.0)
MCV: 89.2 fl (ref 78.0–100.0)
Monocytes Absolute: 0.8 10*3/uL (ref 0.1–1.0)
Monocytes Relative: 6.3 % (ref 3.0–12.0)
Neutro Abs: 7.7 10*3/uL (ref 1.4–7.7)
Neutrophils Relative %: 63.6 % (ref 43.0–77.0)
Platelets: 315 10*3/uL (ref 150.0–400.0)
RBC: 4.79 Mil/uL (ref 4.22–5.81)
RDW: 13.7 % (ref 11.5–15.5)
WBC: 12 10*3/uL — ABNORMAL HIGH (ref 4.0–10.5)

## 2020-02-09 LAB — PROTIME-INR
INR: 1 ratio (ref 0.8–1.0)
Prothrombin Time: 11.3 s (ref 9.6–13.1)

## 2020-02-09 LAB — BRAIN NATRIURETIC PEPTIDE: Pro B Natriuretic peptide (BNP): 115 pg/mL — ABNORMAL HIGH (ref 0.0–100.0)

## 2020-02-09 NOTE — Assessment & Plan Note (Addendum)
Upcoming R total shoulder replacement [facility]).

## 2020-02-09 NOTE — Patient Instructions (Addendum)
EKG today Labs and chest xray today.  Blood pressure is staying too high - I want you to check your blood pressure over the weekend and call me with readings on Monday. If staying high, we may be changing your BP meds around.

## 2020-02-09 NOTE — Assessment & Plan Note (Addendum)
RCRI = 0.  Check CXR, EKG and labs including BNP.  Will forward results to ortho. Assuming reassuring results, anticipate adequately low risk to proceed with surgery.  See above for hypertension plan.   ADDENDUM ==> BNP returned slightly elevated so recommend EKG in PACU and troponin measured daily for 2-3 days postop.

## 2020-02-09 NOTE — Progress Notes (Addendum)
This visit was conducted in person.  BP (!) 156/94 (BP Location: Right Arm, Patient Position: Sitting, Cuff Size: Normal)   Pulse 73   Temp 97.9 F (36.6 C) (Temporal)   Ht 5\' 10"  (1.778 m)   Wt 198 lb 7 oz (90 kg)   SpO2 95%   BMI 28.47 kg/m   BP Readings from Last 3 Encounters:  02/09/20 (!) 156/94  06/29/19 (!) 168/110  06/24/18 140/82  Elevated to 160/100 on repeat  CC: pre-op eval Subjective:    Patient ID: [id number], male    DOB: [birth year], 64 y.o.   MRN: [id number]  HPI: Roy Galvan is a 64 y.o. male presenting on 02/09/2020 for Pre-op Exam   Upcoming R total shoulder replacement 02/11/2020) scheduled for 02/20/2020, unsure anesthesia type. Shoulder injection didn't help. Shoulder pain wakes him up from sleep. Notes decreased strength. He thinks he may have had COVID 09/2019.   He has not had general anesthesia in the past or prior surgical procedures besides colonoscopy 2011.   Known hypertensive, BP elevated today despite taking enalapril 20mg  bid, amlodipine 10mg  daily, toprol XL 50mg  daily and maxzide 37.5/25mg  daily. Attributes some elevation to shoulder pain. Has not been checking BP at home. Already limits salt in diet.   Denies chest pain, dyspnea, headache, dizziness, abd pain. No fevers/chills. Weight gain due to sedentary due to shoulder pain.      Relevant past medical, surgical, family and social history reviewed and updated as indicated. Interim medical history since our last visit reviewed. Allergies and medications reviewed and updated. Outpatient Medications Prior to Visit  Medication Sig Dispense Refill  . amLODipine (NORVASC) 10 MG tablet Take 1 tablet (10 mg total) by mouth daily. 90 tablet 3  . aspirin 81 MG tablet Take 81 mg by mouth daily.    . enalapril (VASOTEC) 20 MG tablet Take 1 tablet (20 mg total) by mouth 2 (two) times daily. 180 tablet 3  . metoprolol succinate (TOPROL-XL) 50 MG 24 hr tablet Take 1 tablet (50 mg total) by mouth  daily. Take with or immediately following a meal. 90 tablet 3  . triamterene-hydrochlorothiazide (MAXZIDE-25) 37.5-25 MG tablet Take 1 tablet by mouth daily. 90 tablet 3  . metoprolol tartrate (LOPRESSOR) 25 MG tablet Take 1 tablet (25 mg total) by mouth 2 (two) times daily. 180 tablet 3   No facility-administered medications prior to visit.     Per HPI unless specifically indicated in ROS section below Review of Systems Objective:  BP (!) 156/94 (BP Location: Right Arm, Patient Position: Sitting, Cuff Size: Normal)   Pulse 73   Temp 97.9 F (36.6 C) (Temporal)   Ht 5\' 10"  (1.778 m)   Wt 198 lb 7 oz (90 kg)   SpO2 95%   BMI 28.47 kg/m   Wt Readings from Last 3 Encounters:  02/09/20 198 lb 7 oz (90 kg)  06/29/19 185 lb 8 oz (84.1 kg)  06/24/18 171 lb 8 oz (77.8 kg)      Physical Exam Vitals and nursing note reviewed.  Constitutional:      Appearance: Normal appearance. He is not ill-appearing.     Comments: Uncomfortable due to R shoulder pain  Eyes:     Extraocular Movements: Extraocular movements intact.     Pupils: Pupils are equal, round, and reactive to light.  Cardiovascular:     Rate and Rhythm: Normal rate and regular rhythm.     Pulses: Normal pulses.  Heart sounds: Normal heart sounds. No murmur.  Pulmonary:     Effort: Pulmonary effort is normal. No respiratory distress.     Breath sounds: Normal breath sounds. No wheezing, rhonchi or rales.  Musculoskeletal:     Right lower leg: No edema.     Left lower leg: No edema.  Skin:    General: Skin is warm and dry.     Findings: No rash.  Neurological:     Mental Status: He is alert.  Psychiatric:        Mood and Affect: Mood normal.        Behavior: Behavior normal.       Results for orders placed or performed in visit on 02/09/20  Comprehensive metabolic panel  Result Value Ref Range   Sodium 138 135 - 145 mEq/L   Potassium 3.4 (L) 3.5 - 5.1 mEq/L   Chloride 100 96 - 112 mEq/L   CO2 32 19 - 32  mEq/L   Glucose, Bld 101 (H) 70 - 99 mg/dL   BUN 20 6 - 23 mg/dL   Creatinine, Ser 1.11 0.40 - 1.50 mg/dL   Total Bilirubin 0.8 0.2 - 1.2 mg/dL   Alkaline Phosphatase 77 39 - 117 U/L   AST 19 0 - 37 U/L   ALT 18 0 - 53 U/L   Total Protein 7.7 6.0 - 8.3 g/dL   Albumin 4.6 3.5 - 5.2 g/dL   GFR 67.09 >60.00 mL/min   Calcium 9.6 8.4 - 10.5 mg/dL  CBC with Differential/Platelet  Result Value Ref Range   WBC 12.0 (H) 4.0 - 10.5 K/uL   RBC 4.79 4.22 - 5.81 Mil/uL   Hemoglobin 14.4 13.0 - 17.0 g/dL   HCT 42.8 39.0 - 52.0 %   MCV 89.2 78.0 - 100.0 fl   MCHC 33.6 30.0 - 36.0 g/dL   RDW 13.7 11.5 - 15.5 %   Platelets 315.0 150.0 - 400.0 K/uL   Neutrophils Relative % 63.6 43.0 - 77.0 %   Lymphocytes Relative 26.9 12.0 - 46.0 %   Monocytes Relative 6.3 3.0 - 12.0 %   Eosinophils Relative 2.3 0.0 - 5.0 %   Basophils Relative 0.9 0.0 - 3.0 %   Neutro Abs 7.7 1.4 - 7.7 K/uL   Lymphs Abs 3.2 0.7 - 4.0 K/uL   Monocytes Absolute 0.8 0.1 - 1.0 K/uL   Eosinophils Absolute 0.3 0.0 - 0.7 K/uL   Basophils Absolute 0.1 0.0 - 0.1 K/uL  Protime-INR  Result Value Ref Range   INR 1.0 0.8 - 1.0 ratio   Prothrombin Time 11.3 9.6 - 13.1 sec  Brain natriuretic peptide  Result Value Ref Range   Pro B Natriuretic peptide (BNP) 115.0 (H) 0.0 - 100.0 pg/mL   EKG - NSR rate 60, mild LAD, normal intervals, no acute ST/T changes, RSR septally unchanged from prior.  Assessment & Plan:  This visit occurred during the SARS-CoV-2 public health emergency.  Safety protocols were in place, including screening questions prior to the visit, additional usage of staff PPE, and extensive cleaning of exam room while observing appropriate contact time as indicated for disinfecting solutions.   Problem List Items Addressed This Visit    Right shoulder pain    Upcoming R total shoulder replacement Tamera Punt).       Pre-op evaluation - Primary    RCRI = 0.  Check CXR, EKG and labs including BNP.  Will forward results to  ortho. Assuming reassuring results, anticipate adequately low risk to  proceed with surgery.  See above for hypertension plan.   ADDENDUM ==> BNP returned slightly elevated so recommend EKG in PACU and troponin measured daily for 2-3 days postop.       Relevant Orders   EKG 12-Lead (Completed)   Comprehensive metabolic panel (Completed)   CBC with Differential/Platelet (Completed)   Protime-INR (Completed)   Brain natriuretic peptide (Completed)   DG Chest 2 View (Completed)   HTN (hypertension)    BP remaining elevated despite 4 drug regimen.  Anticipate shoulder pain contributing to poor control.  Check labs today, I asked him to check BP at home over weekend and call us Monday with readings - if remaining elevated at home, low threshold to start spironolactone in place of maxzide. Pt agrees with plan.           No orders of the defined types were placed in this encounter.  Orders Placed This Encounter  Procedures  . DG Chest 2 View    Standing Status:   Future    Number of Occurrences:   1    Standing Expiration Date:   02/08/2021    Order Specific Question:   Reason for Exam (SYMPTOM  OR DIAGNOSIS REQUIRED)    Answer:   preop eval    Order Specific Question:   Preferred imaging location?    Answer:   [facility]    Order Specific Question:   Radiology Contrast Protocol - do NOT remove file path    Answer:   [email or web address]  . Comprehensive metabolic panel  . CBC with Differential/Platelet  . Protime-INR  . Brain natriuretic peptide  . EKG 12-Lead    Patient Instructions  EKG today Labs and chest xray today.  Blood pressure is staying too high - I want you to check your blood pressure over the weekend and call me with readings on Monday. If staying high, we may be changing your BP meds around.    Follow up plan: Return if symptoms worsen or fail to improve.  Eustaquio Boyden, MD

## 2020-02-09 NOTE — Telephone Encounter (Signed)
Seeing today 

## 2020-02-09 NOTE — Assessment & Plan Note (Addendum)
BP remaining elevated despite 4 drug regimen.  Anticipate shoulder pain contributing to poor control.  Check labs today, I asked him to check BP at home over weekend and call us Monday with readings - if remaining elevated at home, low threshold to start spironolactone in place of maxzide. Pt agrees with plan.

## 2020-02-12 ENCOUNTER — Telehealth: Payer: Self-pay | Admitting: Family Medicine

## 2020-02-12 DIAGNOSIS — I10 Essential (primary) hypertension: Secondary | ICD-10-CM

## 2020-02-12 MED ORDER — SPIRONOLACTONE 25 MG PO TABS: 25.0000 mg | ORAL_TABLET | Freq: Every day | ORAL | 6 refills | Status: DC

## 2020-02-12 NOTE — Telephone Encounter (Addendum)
Noted. Let's start spironolactone 25mg  daily in place of maxzide (triamterene/hydrochlorothiazide).  30d supply sent to pharmacy.  I'd like him to return Friday or Monday for potassium check (blood work only). Ordered.  plz forward phone note attn Dr Sunday at [facility].

## 2020-02-12 NOTE — Telephone Encounter (Signed)
Patient saw Dr.G on Friday.  Patient was asked to check his blood pressure at home over the weekend.  Patient said his blood pressure yesterday morning was 148/91 and the evening was 153/93. Patient is requesting if a rx is to be called in, do a 30 day rx to [facility].

## 2020-02-12 NOTE — Telephone Encounter (Signed)
Spoke with pt relaying Dr. Timoteo Expose message.  Pt verbalizes understanding.  Scheduled lab visit on 02/16/20 at 7:50.

## 2020-02-12 NOTE — Addendum Note (Signed)
Addended by: Eustaquio Boyden on: 02/12/2020 03:57 PM   Modules accepted: Orders

## 2020-02-14 ENCOUNTER — Other Ambulatory Visit: Payer: Self-pay | Admitting: Orthopedic Surgery

## 2020-02-14 ENCOUNTER — Telehealth: Payer: Self-pay | Admitting: Family Medicine

## 2020-02-14 DIAGNOSIS — M6281 Muscle weakness (generalized): Secondary | ICD-10-CM | POA: Diagnosis not present

## 2020-02-14 DIAGNOSIS — M1991 Primary osteoarthritis, unspecified site: Secondary | ICD-10-CM | POA: Diagnosis not present

## 2020-02-14 DIAGNOSIS — M25511 Pain in right shoulder: Secondary | ICD-10-CM | POA: Diagnosis not present

## 2020-02-14 NOTE — Telephone Encounter (Signed)
Pt is wanting to know why his surgery was changed from outpatient to inpatient at [facility] with Overnight stay. Pt aware that Dr Lynwood Dawley with Dr Reece Agar -- he contacted Dr Ave Filter and their office advised that Dr Reece Agar requested the change.  Please advise, thanks.

## 2020-02-14 NOTE — Telephone Encounter (Signed)
Spoke with patient, reviewed reasoning behind recommended change. He expressed understanding and was ok with this.

## 2020-02-15 NOTE — Patient Instructions (Addendum)
DUE TO COVID-19 ONLY ONE VISITOR ARE ALLOWED TO COME WITH YOU AND STAY IN THE WAITING ROOM ONLY DURING PRE OP AND PROCEDURE. THEN TWO VISITORS MAY VISIT WITH YOU IN YOUR PRIVATE ROOM DURING VISITING HOURS ONLY!!   COVID SWAB TESTING MUST BE COMPLETED ON:  Tuesday, February 20, 2020  At 9:30 AM  [address], [place] [facility] enter pre surgical testing line (Must self quarantine after testing. Follow instructions on handout.)             Your procedure is scheduled on: Thursday, February 22, 2020   Report to [facility] Main  Entrance    Report to admitting at 8:15 AM   Call this number if you have problems the morning of surgery [phone]   Do not eat food :After Midnight.   May have liquids until 7:45 AM day of surgery   CLEAR LIQUID DIET  Foods Allowed                                                                     Foods Excluded  Water, Black Coffee and tea, regular and decaf                             liquids that you cannot  Plain Jell-O in any flavor  (No red)                                           see through such as: Fruit ices (not with fruit pulp)                                     milk, soups, orange juice  Iced Popsicles (No red)                                    All solid food                                  Apple juices Sports drinks like Gatorade (No red) Lightly seasoned clear broth or consume(fat free) Sugar, honey syrup  Sample Menu Breakfast                                Lunch                                     Supper Cranberry juice                    Beef broth                            Chicken broth Jell-O  Grape juice                           Apple juice Coffee or tea                        Jell-O                                      Popsicle                                                Coffee or tea                        Coffee or tea   Complete one Ensure drink the  morning of surgery at   7:45 AM the day of surgery.  Oral Hygiene is also important to reduce your risk of infection.                                    Remember - BRUSH YOUR TEETH THE MORNING OF SURGERY WITH YOUR REGULAR TOOTHPASTE   Do NOT smoke after Midnight   Take these medicines the morning of surgery with A SIP OF WATER: Amlodipine, Metoprolol                               You may not have any metal on your body including jewelry, and body piercings             Do not wear lotions, powders, perfumes/cologne, or deodorant                        Men may shave face and neck.   Do not bring valuables to the hospital. [facility] IS NOT             RESPONSIBLE   FOR VALUABLES.   Contacts, dentures or bridgework may not be worn into surgery.   Bring small overnight bag day of surgery.    Patients discharged the day of surgery will not be allowed to drive home.   Special Instructions: Bring a copy of your healthcare power of attorney and living will documents         the day of surgery if you haven't scanned them in before.              Please read over the following fact sheets you were given: IF YOU HAVE QUESTIONS ABOUT YOUR PRE OP INSTRUCTIONS PLEASE CALL [phone]  [place]- Preparing for Total Shoulder Arthroplasty    Before surgery, you can play an important role. Because skin is not sterile, your skin needs to be as free of germs as possible. You can reduce the number of germs on your skin by using the following products. . Benzoyl Peroxide Gel o Reduces the number of germs present on the skin o Applied twice a day to shoulder area starting two days before surgery    ==================================================================  Please follow these instructions carefully:  BENZOYL PEROXIDE 5% GEL  Please do  not use if you have an allergy to benzoyl peroxide.   If your skin becomes reddened/irritated stop using the benzoyl peroxide.  Starting two days before  surgery, apply as follows:  (Tuesday and Wednesday)  1. Apply benzoyl peroxide in the morning and at night. Apply after taking a shower. If you are not taking a shower clean entire shoulder front, back, and side along with the armpit with a clean wet washcloth.  2. Place a quarter-sized dollop on your shoulder and rub in thoroughly, making sure to cover the front, back, and side of your shoulder, along with the armpit.   2 days before ____ AM   ____ PM              1 day before ____ AM   ____ PM                         3. Do this twice a day for two days.  (Last application is the night before surgery, AFTER using the CHG soap as described below).  4. Do NOT apply benzoyl peroxide gel on the day of surgery.   - Preparing for Surgery Before surgery, you can play an important role.  Because skin is not sterile, your skin needs to be as free of germs as possible.  You can reduce the number of germs on your skin by washing with CHG (chlorahexidine gluconate) soap before surgery.  CHG is an antiseptic cleaner which kills germs and bonds with the skin to continue killing germs even after washing. Please DO NOT use if you have an allergy to CHG or antibacterial soaps.  If your skin becomes reddened/irritated stop using the CHG and inform your nurse when you arrive at Short Stay. Do not shave (including legs and underarms) for at least 48 hours prior to the first CHG shower.  You may shave your face/neck.  Please follow these instructions carefully:  1.  Shower with CHG Soap the night before surgery and the  morning of surgery.  2.  If you choose to wash your hair, wash your hair first as usual with your normal  shampoo.  3.  After you shampoo, rinse your hair and body thoroughly to remove the shampoo.                             4.  Use CHG as you would any other liquid soap.  You can apply chg directly to the skin and wash.  Gently with a scrungie or clean washcloth.  5.  Apply the CHG Soap  to your body ONLY FROM THE NECK DOWN.   Do   not use on face/ open                           Wound or open sores. Avoid contact with eyes, ears mouth and   genitals (private parts).                       Wash face,  Genitals (private parts) with your normal soap.             6.  Wash thoroughly, paying special attention to the area where your    surgery  will be performed.  7.  Thoroughly rinse your body with warm water from the neck down.  8.  DO NOT shower/wash  with your normal soap after using and rinsing off the CHG Soap.                9.  Pat yourself dry with a clean towel.            10.  Wear clean pajamas.            11.  Place clean sheets on your bed the night of your first shower and do not  sleep with pets. Day of Surgery : Do not apply any lotions/deodorants the morning of surgery.  Please wear clean clothes to the hospital/surgery center.  FAILURE TO FOLLOW THESE INSTRUCTIONS MAY RESULT IN THE CANCELLATION OF YOUR SURGERY  PATIENT SIGNATURE_________________________________  NURSE SIGNATURE__________________________________  ________________________________________________________________________   Rogelia MireIncentive Spirometer  An incentive spirometer is a tool that can help keep your lungs clear and active. This tool measures how well you are filling your lungs with each breath. Taking long deep breaths may help reverse or decrease the chance of developing breathing (pulmonary) problems (especially infection) following:  A long period of time when you are unable to move or be active. BEFORE THE PROCEDURE   If the spirometer includes an indicator to show your best effort, your nurse or respiratory therapist will set it to a desired goal.  If possible, sit up straight or lean slightly forward. Try not to slouch.  Hold the incentive spirometer in an upright position. INSTRUCTIONS FOR USE  1. Sit on the edge of your bed if possible, or sit up as far as you can in bed or on a  chair. 2. Hold the incentive spirometer in an upright position. 3. Breathe out normally. 4. Place the mouthpiece in your mouth and seal your lips tightly around it. 5. Breathe in slowly and as deeply as possible, raising the piston or the ball toward the top of the column. 6. Hold your breath for 3-5 seconds or for as long as possible. Allow the piston or ball to fall to the bottom of the column. 7. Remove the mouthpiece from your mouth and breathe out normally. 8. Rest for a few seconds and repeat Steps 1 through 7 at least 10 times every 1-2 hours when you are awake. Take your time and take a few normal breaths between deep breaths. 9. The spirometer may include an indicator to show your best effort. Use the indicator as a goal to work toward during each repetition. 10. After each set of 10 deep breaths, practice coughing to be sure your lungs are clear. If you have an incision (the cut made at the time of surgery), support your incision when coughing by placing a pillow or rolled up towels firmly against it. Once you are able to get out of bed, walk around indoors and cough well. You may stop using the incentive spirometer when instructed by your caregiver.  RISKS AND COMPLICATIONS  Take your time so you do not get dizzy or light-headed.  If you are in pain, you may need to take or ask for pain medication before doing incentive spirometry. It is harder to take a deep breath if you are having pain. AFTER USE  Rest and breathe slowly and easily.  It can be helpful to keep track of a log of your progress. Your caregiver can provide you with a simple table to help with this. If you are using the spirometer at home, follow these instructions: SEEK MEDICAL CARE IF:   You are having difficultly using the  spirometer.  You have trouble using the spirometer as often as instructed.  Your pain medication is not giving enough relief while using the spirometer.  You develop fever of 100.5 F  (38.1 C) or higher. SEEK IMMEDIATE MEDICAL CARE IF:   You cough up bloody sputum that had not been present before.  You develop fever of 102 F (38.9 C) or greater.  You develop worsening pain at or near the incision site. MAKE SURE YOU:   Understand these instructions.  Will watch your condition.  Will get help right away if you are not doing well or get worse. Document Released: 01/18/2007 Document Revised: 11/30/2011 Document Reviewed: 03/21/2007 Exit[employer] Patient Information 2014 [employer], [place].   ________________________________________________________________________  WHAT IS A BLOOD TRANSFUSION? Blood Transfusion Information  A transfusion is the replacement of blood or some of its parts. Blood is made up of multiple cells which provide different functions.  Red blood cells carry oxygen and are used for blood loss replacement.  White blood cells fight against infection.  Platelets control bleeding.  Plasma helps clot blood.  Other blood products are available for specialized needs, such as hemophilia or other clotting disorders. BEFORE THE TRANSFUSION  Who gives blood for transfusions?   Healthy volunteers who are fully evaluated to make sure their blood is safe. This is blood bank blood. Transfusion therapy is the safest it has ever been in the practice of medicine. Before blood is taken from a donor, a complete history is taken to make sure that person has no history of diseases nor engages in risky social behavior (examples are intravenous drug use or sexual activity with multiple partners). The donor's travel history is screened to minimize risk of transmitting infections, such as malaria. The donated blood is tested for signs of infectious diseases, such as HIV and hepatitis. The blood is then tested to be sure it is compatible with you in order to minimize the chance of a transfusion reaction. If you or a relative donates blood, this is often done in anticipation  of surgery and is not appropriate for emergency situations. It takes many days to process the donated blood. RISKS AND COMPLICATIONS Although transfusion therapy is very safe and saves many lives, the main dangers of transfusion include:   Getting an infectious disease.  Developing a transfusion reaction. This is an allergic reaction to something in the blood you were given. Every precaution is taken to prevent this. The decision to have a blood transfusion has been considered carefully by your caregiver before blood is given. Blood is not given unless the benefits outweigh the risks. AFTER THE TRANSFUSION  Right after receiving a blood transfusion, you will usually feel much better and more energetic. This is especially true if your red blood cells have gotten low (anemic). The transfusion raises the level of the red blood cells which carry oxygen, and this usually causes an energy increase.  The nurse administering the transfusion will monitor you carefully for complications. HOME CARE INSTRUCTIONS  No special instructions are needed after a transfusion. You may find your energy is better. Speak with your caregiver about any limitations on activity for underlying diseases you may have. SEEK MEDICAL CARE IF:   Your condition is not improving after your transfusion.  You develop redness or irritation at the intravenous (IV) site. SEEK IMMEDIATE MEDICAL CARE IF:  Any of the following symptoms occur over the next 12 hours:  Shaking chills.  You have a temperature by mouth above 102 F (38.9  C), not controlled by medicine.  Chest, back, or muscle pain.  People around you feel you are not acting correctly or are confused.  Shortness of breath or difficulty breathing.  Dizziness and fainting.  You get a rash or develop hives.  You have a decrease in urine output.  Your urine turns a dark color or changes to pink, red, or brown. Any of the following symptoms occur over the next 10  days:  You have a temperature by mouth above 102 F (38.9 C), not controlled by medicine.  Shortness of breath.  Weakness after normal activity.  The white part of the eye turns yellow (jaundice).  You have a decrease in the amount of urine or are urinating less often.  Your urine turns a dark color or changes to pink, red, or brown. Document Released: 09/04/2000 Document Revised: 11/30/2011 Document Reviewed: 04/23/2008 [facility] Patient Information 2014 [place], [place].  _______________________________________________________________________

## 2020-02-16 ENCOUNTER — Encounter (HOSPITAL_COMMUNITY): Payer: Self-pay

## 2020-02-16 ENCOUNTER — Other Ambulatory Visit: Payer: Self-pay

## 2020-02-16 ENCOUNTER — Encounter (HOSPITAL_COMMUNITY)
Admission: RE | Admit: 2020-02-16 | Discharge: 2020-02-16 | Disposition: A | Discharge status: Home / Self Care | Payer: BC Managed Care – PPO | Source: Ambulatory Visit | Attending: Orthopedic Surgery | Admitting: Orthopedic Surgery

## 2020-02-16 ENCOUNTER — Other Ambulatory Visit (INDEPENDENT_AMBULATORY_CARE_PROVIDER_SITE_OTHER): Payer: BC Managed Care – PPO

## 2020-02-16 DIAGNOSIS — I10 Essential (primary) hypertension: Secondary | ICD-10-CM

## 2020-02-16 HISTORY — DX: Unspecified osteoarthritis, unspecified site: M19.90

## 2020-02-16 LAB — BASIC METABOLIC PANEL
BUN: 30 mg/dL — ABNORMAL HIGH (ref 6–23)
CO2: 29 mEq/L (ref 19–32)
Calcium: 8.9 mg/dL (ref 8.4–10.5)
Chloride: 105 mEq/L (ref 96–112)
Creatinine, Ser: 1.12 mg/dL (ref 0.40–1.50)
GFR: 66.39 mL/min (ref >60.00)
Glucose, Bld: 108 mg/dL — ABNORMAL HIGH (ref 70–99)
Potassium: 3.7 mEq/L (ref 3.5–5.1)
Sodium: 143 mEq/L (ref 135–145)

## 2020-02-16 NOTE — Progress Notes (Signed)
COVID Vaccine Completed:No Date COVID Vaccine completed:N/A COVID vaccine manufacturer: N/A  PCP - Dr. Denton Meek last office visit 02/09/20 in epic Cardiologist - N/A  Chest x-ray - 02/09/20 in epic EKG - 02/09/20 in epic Stress Test - N/A ECHO - N/A Cardiac Cath - N/A  Sleep Study - N/A CPAP - N/A  Fasting Blood Sugar - N/A Checks Blood Sugar _N/A____ times a day  Blood Thinner Instructions: N/A Aspirin Instructions: Yes Last Dose: will stop 3 days prior to surgery  Anesthesia review: N/A  Patient denies shortness of breath, fever, cough and chest pain at PAT appointment   Patient verbalized understanding of instructions that were given to them at the PAT appointment. Patient was also instructed that they will need to review over the PAT instructions again at home before surgery.

## 2020-02-20 ENCOUNTER — Encounter (HOSPITAL_COMMUNITY)
Admission: RE | Admit: 2020-02-20 | Discharge: 2020-02-20 | Disposition: A | Discharge status: Home / Self Care | Payer: BC Managed Care – PPO | Source: Ambulatory Visit | Attending: Orthopedic Surgery | Admitting: Orthopedic Surgery

## 2020-02-20 ENCOUNTER — Other Ambulatory Visit (HOSPITAL_COMMUNITY)
Admission: RE | Admit: 2020-02-20 | Discharge: 2020-02-20 | Disposition: A | Discharge status: Home / Self Care | Payer: BC Managed Care – PPO | Source: Ambulatory Visit | Attending: Orthopedic Surgery | Admitting: Orthopedic Surgery

## 2020-02-20 ENCOUNTER — Other Ambulatory Visit: Payer: Self-pay

## 2020-02-20 DIAGNOSIS — Z01812 Encounter for preprocedural laboratory examination: Secondary | ICD-10-CM | POA: Diagnosis not present

## 2020-02-20 DIAGNOSIS — Z20822 Contact with and (suspected) exposure to covid-19: Secondary | ICD-10-CM | POA: Diagnosis not present

## 2020-02-20 DIAGNOSIS — Z01811 Encounter for preprocedural respiratory examination: Secondary | ICD-10-CM

## 2020-02-20 LAB — CBC WITH DIFFERENTIAL/PLATELET
Abs Immature Granulocytes: 0.03 10*3/uL (ref 0.00–0.07)
Basophils Absolute: 0.1 10*3/uL (ref 0.0–0.1)
Basophils Relative: 1 %
Eosinophils Absolute: 0.2 10*3/uL (ref 0.0–0.5)
Eosinophils Relative: 2 %
HCT: 46.2 % (ref 39.0–52.0)
Hemoglobin: 15.3 g/dL (ref 13.0–17.0)
Immature Granulocytes: 0 %
Lymphocytes Relative: 27 %
Lymphs Abs: 2.7 10*3/uL (ref 0.7–4.0)
MCH: 29.9 pg (ref 26.0–34.0)
MCHC: 33.1 g/dL (ref 30.0–36.0)
MCV: 90.4 fL (ref 80.0–100.0)
Monocytes Absolute: 0.5 10*3/uL (ref 0.1–1.0)
Monocytes Relative: 5 %
Neutro Abs: 6.5 10*3/uL (ref 1.7–7.7)
Neutrophils Relative %: 65 %
Platelets: 294 10*3/uL (ref 150–400)
RBC: 5.11 MIL/uL (ref 4.22–5.81)
RDW: 12.8 % (ref 11.5–15.5)
WBC: 10 10*3/uL (ref 4.0–10.5)
nRBC: 0 % (ref 0.0–0.2)

## 2020-02-20 LAB — PROTIME-INR
INR: 1 (ref 0.8–1.2)
Prothrombin Time: 12.8 seconds (ref 11.4–15.2)

## 2020-02-20 LAB — COMPREHENSIVE METABOLIC PANEL
ALT: 18 U/L (ref 0–44)
AST: 19 U/L (ref 15–41)
Albumin: 4.2 g/dL (ref 3.5–5.0)
Alkaline Phosphatase: 68 U/L (ref 38–126)
Anion gap: 10 (ref 5–15)
BUN: 15 mg/dL (ref 8–23)
CO2: 25 mmol/L (ref 22–32)
Calcium: 8.7 mg/dL — ABNORMAL LOW (ref 8.9–10.3)
Chloride: 107 mmol/L (ref 98–111)
Creatinine, Ser: 0.95 mg/dL (ref 0.61–1.24)
GFR calc Af Amer: >60 mL/min (ref >60)
GFR calc non Af Amer: >60 mL/min (ref >60)
Glucose, Bld: 110 mg/dL — ABNORMAL HIGH (ref 70–99)
Potassium: 4 mmol/L (ref 3.5–5.1)
Sodium: 142 mmol/L (ref 135–145)
Total Bilirubin: 0.8 mg/dL (ref 0.3–1.2)
Total Protein: 7.3 g/dL (ref 6.5–8.1)

## 2020-02-20 LAB — APTT: aPTT: 28 seconds (ref 24–36)

## 2020-02-20 LAB — URINALYSIS, ROUTINE W REFLEX MICROSCOPIC
Bilirubin Urine: NEGATIVE
Glucose, UA: NEGATIVE mg/dL
Hgb urine dipstick: NEGATIVE
Ketones, ur: NEGATIVE mg/dL
Leukocytes,Ua: NEGATIVE
Nitrite: NEGATIVE
Protein, ur: NEGATIVE mg/dL
Specific Gravity, Urine: 1.009 (ref 1.005–1.030)
pH: 7 (ref 5.0–8.0)

## 2020-02-20 LAB — SARS CORONAVIRUS 2 (TAT 6-24 HRS): SARS Coronavirus 2: NEGATIVE

## 2020-02-20 LAB — SURGICAL PCR SCREEN
MRSA, PCR: NEGATIVE
Staphylococcus aureus: NEGATIVE

## 2020-02-20 LAB — ABO/RH: ABO/RH(D): A POS

## 2020-02-22 ENCOUNTER — Observation Stay (HOSPITAL_COMMUNITY)
Admission: RE | Admit: 2020-02-22 | Discharge: 2020-02-23 | Disposition: A | Discharge status: Home / Self Care | Payer: BC Managed Care – PPO | Attending: Orthopedic Surgery | Admitting: Orthopedic Surgery

## 2020-02-22 ENCOUNTER — Ambulatory Visit (HOSPITAL_COMMUNITY): Payer: BC Managed Care – PPO | Admitting: Certified Registered Nurse Anesthetist

## 2020-02-22 ENCOUNTER — Encounter (HOSPITAL_COMMUNITY): Payer: Self-pay | Admitting: Orthopedic Surgery

## 2020-02-22 ENCOUNTER — Encounter (HOSPITAL_COMMUNITY)
Admission: RE | Disposition: A | Discharge status: Home / Self Care | Payer: Self-pay | Source: Home / Self Care | Attending: Orthopedic Surgery

## 2020-02-22 ENCOUNTER — Observation Stay (HOSPITAL_COMMUNITY): Payer: BC Managed Care – PPO

## 2020-02-22 ENCOUNTER — Other Ambulatory Visit: Payer: Self-pay

## 2020-02-22 DIAGNOSIS — I10 Essential (primary) hypertension: Secondary | ICD-10-CM | POA: Insufficient documentation

## 2020-02-22 DIAGNOSIS — M19011 Primary osteoarthritis, right shoulder: Secondary | ICD-10-CM | POA: Diagnosis not present

## 2020-02-22 DIAGNOSIS — Z79899 Other long term (current) drug therapy: Secondary | ICD-10-CM | POA: Diagnosis not present

## 2020-02-22 DIAGNOSIS — Z96611 Presence of right artificial shoulder joint: Secondary | ICD-10-CM

## 2020-02-22 DIAGNOSIS — G8918 Other acute postprocedural pain: Secondary | ICD-10-CM | POA: Diagnosis not present

## 2020-02-22 DIAGNOSIS — Z471 Aftercare following joint replacement surgery: Secondary | ICD-10-CM | POA: Diagnosis not present

## 2020-02-22 DIAGNOSIS — Z8616 Personal history of COVID-19: Secondary | ICD-10-CM | POA: Insufficient documentation

## 2020-02-22 DIAGNOSIS — Z7982 Long term (current) use of aspirin: Secondary | ICD-10-CM | POA: Insufficient documentation

## 2020-02-22 DIAGNOSIS — E785 Hyperlipidemia, unspecified: Secondary | ICD-10-CM | POA: Diagnosis not present

## 2020-02-22 HISTORY — PX: TOTAL SHOULDER ARTHROPLASTY: SHX126

## 2020-02-22 LAB — TYPE AND SCREEN
ABO/RH(D): A POS
Antibody Screen: NEGATIVE

## 2020-02-22 SURGERY — ARTHROPLASTY, SHOULDER, TOTAL
Anesthesia: General | Site: Shoulder | Laterality: Right

## 2020-02-22 MED ORDER — ASPIRIN EC 325 MG PO TBEC
325.0000 mg | DELAYED_RELEASE_TABLET | Freq: Every day | ORAL | Status: DC
  Administered 2020-02-23: 325 mg via ORAL
  Filled 2020-02-22: qty 1

## 2020-02-22 MED ORDER — EPHEDRINE 5 MG/ML INJ
INTRAVENOUS | Status: AC
  Filled 2020-02-22: qty 10

## 2020-02-22 MED ORDER — SUGAMMADEX SODIUM 500 MG/5ML IV SOLN
INTRAVENOUS | Status: AC
  Filled 2020-02-22: qty 5

## 2020-02-22 MED ORDER — ONDANSETRON HCL 4 MG PO TABS: 4.0000 mg | ORAL_TABLET | Freq: Four times a day (QID) | ORAL | Status: DC | PRN

## 2020-02-22 MED ORDER — HYDROMORPHONE HCL 1 MG/ML IJ SOLN: 0.5000 mg | INTRAMUSCULAR | Status: DC | PRN

## 2020-02-22 MED ORDER — ONDANSETRON HCL 4 MG/2ML IJ SOLN: 4.0000 mg | Freq: Four times a day (QID) | INTRAMUSCULAR | Status: DC | PRN

## 2020-02-22 MED ORDER — PHENYLEPHRINE 40 MCG/ML (10ML) SYRINGE FOR IV PUSH (FOR BLOOD PRESSURE SUPPORT)
PREFILLED_SYRINGE | INTRAVENOUS | Status: DC | PRN
  Administered 2020-02-22: 80 ug via INTRAVENOUS

## 2020-02-22 MED ORDER — FENTANYL CITRATE (PF) 100 MCG/2ML IJ SOLN
50.0000 ug | INTRAMUSCULAR | Status: DC
  Administered 2020-02-22: 100 ug via INTRAVENOUS
  Filled 2020-02-22: qty 2

## 2020-02-22 MED ORDER — METOCLOPRAMIDE HCL 5 MG/ML IJ SOLN: 5.0000 mg | Freq: Three times a day (TID) | INTRAMUSCULAR | Status: DC | PRN

## 2020-02-22 MED ORDER — FENTANYL CITRATE (PF) 100 MCG/2ML IJ SOLN
INTRAMUSCULAR | Status: DC | PRN
  Administered 2020-02-22 (×2): 50 ug via INTRAVENOUS

## 2020-02-22 MED ORDER — FENTANYL CITRATE (PF) 100 MCG/2ML IJ SOLN
INTRAMUSCULAR | Status: AC
  Filled 2020-02-22: qty 2

## 2020-02-22 MED ORDER — ZOLPIDEM TARTRATE 5 MG PO TABS: 5.0000 mg | ORAL_TABLET | Freq: Every evening | ORAL | Status: DC | PRN

## 2020-02-22 MED ORDER — ACETAMINOPHEN 500 MG PO TABS
1000.0000 mg | ORAL_TABLET | Freq: Four times a day (QID) | ORAL | Status: DC
  Administered 2020-02-22 – 2020-02-23 (×3): 1000 mg via ORAL
  Filled 2020-02-22 (×3): qty 2

## 2020-02-22 MED ORDER — DEXAMETHASONE SODIUM PHOSPHATE 10 MG/ML IJ SOLN
INTRAMUSCULAR | Status: DC | PRN
  Administered 2020-02-22: 10 mg via INTRAVENOUS

## 2020-02-22 MED ORDER — OXYCODONE HCL 5 MG PO TABS: 5.0000 mg | ORAL_TABLET | ORAL | Status: DC | PRN

## 2020-02-22 MED ORDER — HYDROMORPHONE HCL 1 MG/ML IJ SOLN: 0.2500 mg | INTRAMUSCULAR | Status: DC | PRN

## 2020-02-22 MED ORDER — ACETAMINOPHEN 325 MG PO TABS: 325.0000 mg | ORAL_TABLET | Freq: Four times a day (QID) | ORAL | Status: DC | PRN

## 2020-02-22 MED ORDER — SENNOSIDES-DOCUSATE SODIUM 8.6-50 MG PO TABS: 1.0000 | ORAL_TABLET | Freq: Every evening | ORAL | Status: DC | PRN

## 2020-02-22 MED ORDER — CEFAZOLIN SODIUM-DEXTROSE 2-4 GM/100ML-% IV SOLN
2.0000 g | Freq: Four times a day (QID) | INTRAVENOUS | Status: AC
  Administered 2020-02-22 – 2020-02-23 (×3): 2 g via INTRAVENOUS
  Filled 2020-02-22 (×3): qty 100

## 2020-02-22 MED ORDER — PHENYLEPHRINE HCL-NACL 10-0.9 MG/250ML-% IV SOLN
INTRAVENOUS | Status: DC | PRN
  Administered 2020-02-22: 50 ug/min via INTRAVENOUS

## 2020-02-22 MED ORDER — MEPERIDINE HCL 50 MG/ML IJ SOLN: 6.2500 mg | INTRAMUSCULAR | Status: DC | PRN

## 2020-02-22 MED ORDER — MENTHOL 3 MG MT LOZG: 1.0000 | LOZENGE | OROMUCOSAL | Status: DC | PRN

## 2020-02-22 MED ORDER — ROCURONIUM BROMIDE 10 MG/ML (PF) SYRINGE
PREFILLED_SYRINGE | INTRAVENOUS | Status: DC | PRN
  Administered 2020-02-22: 90 mg via INTRAVENOUS

## 2020-02-22 MED ORDER — SPIRONOLACTONE 25 MG PO TABS
25.0000 mg | ORAL_TABLET | Freq: Every day | ORAL | Status: DC
  Filled 2020-02-22: qty 1

## 2020-02-22 MED ORDER — MIDAZOLAM HCL 2 MG/2ML IJ SOLN
1.0000 mg | INTRAMUSCULAR | Status: DC
  Administered 2020-02-22: 2 mg via INTRAVENOUS
  Filled 2020-02-22: qty 2

## 2020-02-22 MED ORDER — LIDOCAINE 2% (20 MG/ML) 5 ML SYRINGE
INTRAMUSCULAR | Status: DC | PRN
  Administered 2020-02-22: 80 mg via INTRAVENOUS
  Administered 2020-02-22: 20 mg via INTRAVENOUS

## 2020-02-22 MED ORDER — PROPOFOL 10 MG/ML IV BOLUS
INTRAVENOUS | Status: AC
  Filled 2020-02-22: qty 20

## 2020-02-22 MED ORDER — ONDANSETRON HCL 4 MG/2ML IJ SOLN
INTRAMUSCULAR | Status: DC | PRN
  Administered 2020-02-22: 4 mg via INTRAVENOUS

## 2020-02-22 MED ORDER — ENALAPRIL MALEATE 10 MG PO TABS
20.0000 mg | ORAL_TABLET | Freq: Two times a day (BID) | ORAL | Status: DC
  Administered 2020-02-23: 20 mg via ORAL
  Filled 2020-02-22: qty 2

## 2020-02-22 MED ORDER — LIDOCAINE 2% (20 MG/ML) 5 ML SYRINGE
INTRAMUSCULAR | Status: AC
  Filled 2020-02-22: qty 5

## 2020-02-22 MED ORDER — ONDANSETRON HCL 4 MG/2ML IJ SOLN
INTRAMUSCULAR | Status: AC
  Filled 2020-02-22: qty 2

## 2020-02-22 MED ORDER — CHLORHEXIDINE GLUCONATE 0.12 % MT SOLN
15.0000 mL | Freq: Once | OROMUCOSAL | Status: AC
  Administered 2020-02-22: 15 mL via OROMUCOSAL

## 2020-02-22 MED ORDER — SUGAMMADEX SODIUM 500 MG/5ML IV SOLN
INTRAVENOUS | Status: DC | PRN
  Administered 2020-02-22: 320 mg via INTRAVENOUS

## 2020-02-22 MED ORDER — LACTATED RINGERS IV SOLN: INTRAVENOUS | Status: DC

## 2020-02-22 MED ORDER — PHENOL 1.4 % MT LIQD: 1.0000 | OROMUCOSAL | Status: DC | PRN

## 2020-02-22 MED ORDER — METOPROLOL SUCCINATE ER 50 MG PO TB24
50.0000 mg | ORAL_TABLET | Freq: Every day | ORAL | Status: DC
  Administered 2020-02-23: 50 mg via ORAL
  Filled 2020-02-22: qty 1

## 2020-02-22 MED ORDER — DOCUSATE SODIUM 100 MG PO CAPS
100.0000 mg | ORAL_CAPSULE | Freq: Two times a day (BID) | ORAL | Status: DC
  Administered 2020-02-22: 100 mg via ORAL
  Filled 2020-02-22: qty 1

## 2020-02-22 MED ORDER — SODIUM CHLORIDE 0.9 % IV SOLN: INTRAVENOUS | Status: DC

## 2020-02-22 MED ORDER — DIPHENHYDRAMINE HCL 12.5 MG/5ML PO ELIX: 12.5000 mg | ORAL_SOLUTION | ORAL | Status: DC | PRN

## 2020-02-22 MED ORDER — OXYCODONE HCL 5 MG PO TABS: 10.0000 mg | ORAL_TABLET | ORAL | Status: DC | PRN

## 2020-02-22 MED ORDER — TRANEXAMIC ACID-NACL 1000-0.7 MG/100ML-% IV SOLN
1000.0000 mg | INTRAVENOUS | Status: AC
  Administered 2020-02-22: 1000 mg via INTRAVENOUS
  Filled 2020-02-22: qty 100

## 2020-02-22 MED ORDER — DEXAMETHASONE SODIUM PHOSPHATE 10 MG/ML IJ SOLN
INTRAMUSCULAR | Status: AC
  Filled 2020-02-22: qty 1

## 2020-02-22 MED ORDER — PHENYLEPHRINE 40 MCG/ML (10ML) SYRINGE FOR IV PUSH (FOR BLOOD PRESSURE SUPPORT)
PREFILLED_SYRINGE | INTRAVENOUS | Status: AC
  Filled 2020-02-22: qty 20

## 2020-02-22 MED ORDER — AMLODIPINE BESYLATE 10 MG PO TABS
10.0000 mg | ORAL_TABLET | Freq: Every day | ORAL | Status: DC
  Administered 2020-02-23: 10 mg via ORAL
  Filled 2020-02-22: qty 1

## 2020-02-22 MED ORDER — ROCURONIUM BROMIDE 10 MG/ML (PF) SYRINGE
PREFILLED_SYRINGE | INTRAVENOUS | Status: AC
  Filled 2020-02-22: qty 10

## 2020-02-22 MED ORDER — PROPOFOL 10 MG/ML IV BOLUS
INTRAVENOUS | Status: DC | PRN
  Administered 2020-02-22: 130 mg via INTRAVENOUS

## 2020-02-22 MED ORDER — METHOCARBAMOL 500 MG PO TABS: 500.0000 mg | ORAL_TABLET | Freq: Four times a day (QID) | ORAL | Status: DC | PRN

## 2020-02-22 MED ORDER — METHOCARBAMOL 1000 MG/10ML IJ SOLN
500.0000 mg | Freq: Four times a day (QID) | INTRAVENOUS | Status: DC | PRN
  Filled 2020-02-22: qty 5

## 2020-02-22 MED ORDER — CEFAZOLIN SODIUM-DEXTROSE 2-4 GM/100ML-% IV SOLN
2.0000 g | INTRAVENOUS | Status: AC
  Administered 2020-02-22: 2 g via INTRAVENOUS
  Filled 2020-02-22: qty 100

## 2020-02-22 MED ORDER — METOCLOPRAMIDE HCL 5 MG PO TABS: 5.0000 mg | ORAL_TABLET | Freq: Three times a day (TID) | ORAL | Status: DC | PRN

## 2020-02-22 MED ORDER — ONDANSETRON HCL 4 MG/2ML IJ SOLN: 4.0000 mg | Freq: Once | INTRAMUSCULAR | Status: DC | PRN

## 2020-02-22 MED ORDER — ORAL CARE MOUTH RINSE: 15.0000 mL | Freq: Once | OROMUCOSAL | Status: AC

## 2020-02-22 MED ORDER — ALUMINUM HYDROXIDE GEL 320 MG/5ML PO SUSP
15.0000 mL | ORAL | Status: DC | PRN
  Filled 2020-02-22: qty 30

## 2020-02-22 SURGICAL SUPPLY — 74 items
AID PSTN UNV HD RSTRNT DISP (MISCELLANEOUS) ×1
BAG SPEC THK2 15X12 ZIP CLS (MISCELLANEOUS) ×1
BAG ZIPLOCK 12X15 (MISCELLANEOUS) ×3 IMPLANT
BIT DRILL 1.6MX128 (BIT) ×2 IMPLANT
BIT DRILL 1.6MX128MM (BIT) ×1
BLADE SAW SAG 73X25 THK (BLADE) ×2
BLADE SAW SGTL 73X25 THK (BLADE) ×1 IMPLANT
CEMENT BONE DEPUY (Cement) ×3 IMPLANT
CLOSURE STERI-STRIP 1/2X4 (GAUZE/BANDAGES/DRESSINGS) ×1
CLOSURE WOUND 1/2 X4 (GAUZE/BANDAGES/DRESSINGS) ×1
CLSR STERI-STRIP ANTIMIC 1/2X4 (GAUZE/BANDAGES/DRESSINGS) ×1 IMPLANT
COOLER ICEMAN CLASSIC (MISCELLANEOUS) IMPLANT
COVER BACK TABLE 60X90IN (DRAPES) ×3 IMPLANT
COVER SURGICAL LIGHT HANDLE (MISCELLANEOUS) ×3 IMPLANT
COVER WAND RF STERILE (DRAPES) IMPLANT
DRAPE INCISE IOBAN 66X45 STRL (DRAPES) ×3 IMPLANT
DRAPE ORTHO SPLIT 77X108 STRL (DRAPES)
DRAPE POUCH INSTRU U-SHP 10X18 (DRAPES) ×3 IMPLANT
DRAPE SHEET LG 3/4 BI-LAMINATE (DRAPES) ×6 IMPLANT
DRAPE SURG 17X11 SM STRL (DRAPES) ×3 IMPLANT
DRAPE SURG ORHT 6 SPLT 77X108 (DRAPES) IMPLANT
DRAPE U-SHAPE 47X51 STRL (DRAPES) ×3 IMPLANT
DRESSING AQUACEL AG SP 3.5X6 (GAUZE/BANDAGES/DRESSINGS) IMPLANT
DRSG AQUACEL AG ADV 3.5X 6 (GAUZE/BANDAGES/DRESSINGS) ×3 IMPLANT
DRSG AQUACEL AG SP 3.5X6 (GAUZE/BANDAGES/DRESSINGS) ×3
DURAPREP 26ML APPLICATOR (WOUND CARE) ×6 IMPLANT
ELECT BLADE TIP CTD 4 INCH (ELECTRODE) ×3 IMPLANT
ELECT REM PT RETURN 15FT ADLT (MISCELLANEOUS) ×3 IMPLANT
GLENOID AUGMENT CORTILOC RT 25 (Shoulder) ×2 IMPLANT
GLOVE BIO SURGEON STRL SZ7 (GLOVE) ×3 IMPLANT
GLOVE BIO SURGEON STRL SZ7.5 (GLOVE) ×3 IMPLANT
GLOVE BIOGEL PI IND STRL 7.0 (GLOVE) ×1 IMPLANT
GLOVE BIOGEL PI IND STRL 8 (GLOVE) ×1 IMPLANT
GLOVE BIOGEL PI INDICATOR 7.0 (GLOVE) ×2
GLOVE BIOGEL PI INDICATOR 8 (GLOVE) ×2
GOWN STRL REUS W/TWL LRG LVL3 (GOWN DISPOSABLE) ×6 IMPLANT
GOWN STRL REUS W/TWL XL LVL3 (GOWN DISPOSABLE) ×3 IMPLANT
GUIDEWIRE GLENOID 2.5X220 (WIRE) ×2 IMPLANT
HANDPIECE INTERPULSE COAX TIP (DISPOSABLE) ×3
HEAD HUM AEQUALIS 52X19 (Head) ×2 IMPLANT
HEMOSTAT SURGICEL 2X14 (HEMOSTASIS) ×3 IMPLANT
HOOD PEEL AWAY FLYTE STAYCOOL (MISCELLANEOUS) ×9 IMPLANT
HUMERAL STEM AEQUALIS 3X74 (Shoulder) ×3 IMPLANT
KIT BASIN (CUSTOM PROCEDURE TRAY) ×3 IMPLANT
KIT TURNOVER KIT A (KITS) IMPLANT
MANIFOLD NEPTUNE II (INSTRUMENTS) ×3 IMPLANT
NDL TROCAR POINT SZ 2 1/2 (NEEDLE) ×1 IMPLANT
NEEDLE TROCAR POINT SZ 2 1/2 (NEEDLE) ×3 IMPLANT
NS IRRIG 1000ML POUR BTL (IV SOLUTION) ×3 IMPLANT
PACK SHOULDER (CUSTOM PROCEDURE TRAY) ×3 IMPLANT
PAD COLD SHLDR WRAP-ON (PAD) IMPLANT
PROTECTOR NERVE ULNAR (MISCELLANEOUS) IMPLANT
RESTRAINT HEAD UNIVERSAL NS (MISCELLANEOUS) ×3 IMPLANT
RETRIEVER SUT HEWSON (MISCELLANEOUS) ×3 IMPLANT
SET HNDPC FAN SPRY TIP SCT (DISPOSABLE) ×1 IMPLANT
SLING ARM FOAM STRAP LRG (SOFTGOODS) ×2 IMPLANT
SLING ARM IMMOBILIZER LRG (SOFTGOODS) ×2 IMPLANT
SMARTMIX MINI TOWER (MISCELLANEOUS) ×3
SPONGE LAP 18X18 RF (DISPOSABLE) ×3 IMPLANT
STEM HUMERAL AEQUALIS 3X74 (Shoulder) IMPLANT
STRIP CLOSURE SKIN 1/2X4 (GAUZE/BANDAGES/DRESSINGS) ×2 IMPLANT
SUCTION FRAZIER HANDLE 12FR (TUBING) ×3
SUCTION TUBE FRAZIER 12FR DISP (TUBING) ×1 IMPLANT
SUPPORT WRAP ARM LG (MISCELLANEOUS) ×3 IMPLANT
SUT ETHIBOND 2 V 37 (SUTURE) ×3 IMPLANT
SUT MNCRL AB 4-0 PS2 18 (SUTURE) ×3 IMPLANT
SUT VIC AB 2-0 CT1 27 (SUTURE) ×3
SUT VIC AB 2-0 CT1 TAPERPNT 27 (SUTURE) ×1 IMPLANT
TAPE LABRALWHITE 1.5X36 (TAPE) ×3 IMPLANT
TAPE SUT LABRALTAP WHT/BLK (SUTURE) ×3 IMPLANT
TOWEL OR 17X26 10 PK STRL BLUE (TOWEL DISPOSABLE) ×3 IMPLANT
TOWER SMARTMIX MINI (MISCELLANEOUS) ×1 IMPLANT
WATER STERILE IRR 1000ML POUR (IV SOLUTION) ×3 IMPLANT
YANKAUER SUCT BULB TIP 10FT TU (MISCELLANEOUS) ×3 IMPLANT

## 2020-02-22 NOTE — Transfer of Care (Signed)
Immediate Anesthesia Transfer of Care Note  Patient: Roy Galvan  Procedure(s) Performed: TOTAL SHOULDER ARTHROPLASTY (Right Shoulder)  Patient Location: PACU  Anesthesia Type:GA combined with regional for post-op pain  Level of Consciousness: awake, alert  and patient cooperative  Airway & Oxygen Therapy: Patient Spontanous Breathing and Patient connected to face mask oxygen  Post-op Assessment: Report given to RN and Post -op Vital signs reviewed and stable  Post vital signs: Reviewed and stable  Last Vitals:  Vitals Value Taken Time  BP 115/79 02/22/20 1153  Temp    Pulse 65 02/22/20 1158  Resp 13 02/22/20 1158  SpO2 100 % 02/22/20 1158  Vitals shown include unvalidated device data.  Last Pain:  Vitals:   02/22/20 1153  TempSrc:   PainSc: (P) 0-No pain      Patients Stated Pain Goal: 4 (02/22/20 9166)  Complications: No apparent anesthesia complications

## 2020-02-22 NOTE — Anesthesia Preprocedure Evaluation (Signed)
Anesthesia Evaluation  Patient identified by MRN, date of birth, ID band Patient awake    Reviewed: Allergy & Precautions, NPO status , Patient's Chart, lab work & pertinent test results  Airway Mallampati: II  TM Distance: >3 FB Neck ROM: Full    Dental   Pulmonary    Pulmonary exam normal        Cardiovascular hypertension, Pt. on medications Normal cardiovascular exam     Neuro/Psych    GI/Hepatic   Endo/Other    Renal/GU      Musculoskeletal   Abdominal   Peds  Hematology   Anesthesia Other Findings   Reproductive/Obstetrics                             Anesthesia Physical Anesthesia Plan  ASA: II  Anesthesia Plan: General   Post-op Pain Management:  Regional for Post-op pain   Induction: Intravenous  PONV Risk Score and Plan: 2 and Ondansetron and Midazolam  Airway Management Planned: Oral ETT  Additional Equipment:   Intra-op Plan:   Post-operative Plan:   Informed Consent: I have reviewed the patients History and Physical, chart, labs and discussed the procedure including the risks, benefits and alternatives for the proposed anesthesia with the patient or authorized representative who has indicated his/her understanding and acceptance.       Plan Discussed with: CRNA and Surgeon  Anesthesia Plan Comments:         Anesthesia Quick Evaluation

## 2020-02-22 NOTE — Anesthesia Postprocedure Evaluation (Signed)
Anesthesia Post Note  Patient: Roy Galvan  Procedure(s) Performed: TOTAL SHOULDER ARTHROPLASTY (Right Shoulder)     Patient location during evaluation: PACU Anesthesia Type: General Level of consciousness: awake and alert Pain management: pain level controlled Vital Signs Assessment: post-procedure vital signs reviewed and stable Respiratory status: spontaneous breathing, nonlabored ventilation, respiratory function stable and patient connected to nasal cannula oxygen Cardiovascular status: blood pressure returned to baseline and stable Postop Assessment: no apparent nausea or vomiting Anesthetic complications: no    Last Vitals:  Vitals:   02/22/20 1300 02/22/20 1334  BP: 122/81 126/84  Pulse: (!) 59 63  Resp: 14 16  Temp: 36.9 C 36.8 C  SpO2: 98% 99%    Last Pain:  Vitals:   02/22/20 1245  TempSrc:   PainSc: Asleep                 Daxx Tiggs DAVID

## 2020-02-22 NOTE — Anesthesia Procedure Notes (Addendum)
Procedure Name: Intubation Date/Time: 02/22/2020 10:14 AM Performed by: West Pugh, CRNA Pre-anesthesia Checklist: Patient identified, Emergency Drugs available, Suction available, Patient being monitored and Timeout performed Patient Re-evaluated:Patient Re-evaluated prior to induction Oxygen Delivery Method: Circle system utilized Preoxygenation: Pre-oxygenation with 100% oxygen Induction Type: IV induction Ventilation: Mask ventilation without difficulty Laryngoscope Size: Mac and 4 Grade View: Grade I Tube type: Oral Tube size: 7.5 mm Number of attempts: 1 Airway Equipment and Method: Stylet Placement Confirmation: ETT inserted through vocal cords under direct vision,  positive ETCO2,  CO2 detector and breath sounds checked- equal and bilateral Secured at: 21 cm Tube secured with: Tape Dental Injury: Teeth and Oropharynx as per pre-operative assessment

## 2020-02-22 NOTE — Anesthesia Procedure Notes (Signed)
Anesthesia Regional Block: Interscalene brachial plexus block   Pre-Anesthetic Checklist: ,, timeout performed, Correct Patient, Correct Site, Correct Laterality, Correct Procedure, Correct Position, site marked, Risks and benefits discussed,  Surgical consent,  Pre-op evaluation,  At surgeon's request and post-op pain management  Laterality: Right  Prep: chloraprep       Needles:  Injection technique: Single-shot  Needle Type: Other   (ARROW )   Needle Length: 9cm  Needle Gauge: 21   Needle insertion depth: 5 cm   Additional Needles:   Procedures:, nerve stimulator,,,,,,,  Narrative:  Start time: 02/22/2020 9:15 AM End time: 02/22/2020 9:25 AM Injection made incrementally with aspirations every 5 mL.  Performed by: Personally  Anesthesiologist: Arta Bruce, MD  Additional Notes: Monitors applied. Patient sedated. Sterile prep and drape,hand hygiene and sterile gloves were used. Needle position confirmed with evoked response at 0.4 mV.Local anesthetic injected incrementally after negative aspiration.Vascular puncture avoided. No complications. The patient tolerated the procedure well.

## 2020-02-22 NOTE — Evaluation (Signed)
Occupational Therapy Evaluation Patient Details Name: Roy Galvan MRN: [id number] DOB: 07/22/1956 Today's Date: 02/22/2020    History of Present Illness Patient is a 64 year old man s/p TSA.   Clinical Impression   Roy Galvan is s/p Right TSA without functional use of right dominant upper extremity. Therapist provided education and instruction to patient and spouse in regards to exercises, precautions, positioning, donning upper extremity clothing and bathing while maintaining shoulder precautions, ice and edema management and donning/doffing sling. Patient able to assist with donning clothing (with spouse helping), ambulated to bathroom and performed toileting while maintaining shoulder precautions. Patient and spouse verbalized understanding and demonstrated as needed. Patient to follow up with MD for further therapy needs.      Follow Up Recommendations  Follow surgeons recommendation for DC plan and follow-up therapies    Equipment Recommendations  None recommended by OT    Recommendations for Other Services       Precautions / Restrictions Precautions Precautions: Shoulder Shoulder Interventions: Shoulder sling/immobilizer Precaution Booklet Issued: No Restrictions Weight Bearing Restrictions: Yes RUE Weight Bearing: Non weight bearing      Mobility Bed Mobility                  Transfers                      Balance                                           ADL either performed or assessed with clinical judgement   ADL   Eating/Feeding: Set up   Grooming: Set up   Upper Body Bathing: Minimal assistance;Adhering to UE precautions   Lower Body Bathing: Minimal assistance   Upper Body Dressing : Moderate assistance;Adhering to UE precautions   Lower Body Dressing: Moderate assistance   Toilet Transfer: Min guard   Toileting- Architect and Hygiene: Min guard   Tub/ Engineer, structural: Min guard    Functional mobility during ADLs: Min guard       Vision   Vision Assessment?: No apparent visual deficits     Perception     Praxis      Pertinent Vitals/Pain Pain Assessment: No/denies pain     Hand Dominance     Extremity/Trunk Assessment Upper Extremity Assessment Upper Extremity Assessment: RUE deficits/detail RUE Deficits / Details: Impaired ROM, strength and motor control secondary to interscalene block, shoulder precautions and decreased ROM/strength s/p surgery           Communication     Cognition Arousal/Alertness: Awake/alert Behavior During Therapy: WFL for tasks assessed/performed Overall Cognitive Status: Within Functional Limits for tasks assessed                                     General Comments       Exercises     Shoulder Instructions Shoulder Instructions Donning/doffing shirt without moving shoulder: Patient able to independently direct caregiver;Caregiver independent with task Method for sponge bathing under operated UE: Patient able to independently direct caregiver;Caregiver independent with task Donning/doffing sling/immobilizer: Patient able to independently direct caregiver;Caregiver independent with task Correct positioning of sling/immobilizer: Patient able to independently direct caregiver ROM for elbow, wrist and digits of operated UE: Patient able to independently direct caregiver;Caregiver independent with task  Sling wearing schedule (on at all times/off for ADL's): Patient able to independently direct caregiver;Caregiver independent with task Proper positioning of operated UE when showering: Patient able to independently direct caregiver;Caregiver independent with task Dressing change: Patient able to independently direct caregiver;Caregiver independent with task Positioning of UE while sleeping: Patient able to independently direct caregiver;Caregiver independent with task    Home Living Family/patient expects  to be discharged to:: Private residence Living Arrangements: Spouse/significant other;Children Available Help at Discharge: Family                                    Prior Functioning/Environment                   OT Problem List: Decreased strength;Decreased range of motion;Impaired UE functional use      OT Treatment/Interventions:      OT Goals(Current goals can be found in the care plan section) Acute Rehab OT Goals OT Goal Formulation: All assessment and education complete, DC therapy  OT Frequency:     Barriers to D/C:            Co-evaluation              AM-PAC OT "6 Clicks" Daily Activity     Outcome Measure Help from another person eating meals?: A Little Help from another person taking care of personal grooming?: A Little Help from another person toileting, which includes using toliet, bedpan, or urinal?: A Little Help from another person bathing (including washing, rinsing, drying)?: A Little Help from another person to put on and taking off regular upper body clothing?: A Lot Help from another person to put on and taking off regular lower body clothing?: A Lot 6 Click Score: 16   End of Session Nurse Communication: Mobility status  Activity Tolerance: Patient tolerated treatment well Patient left: in bed;with call bell/phone within reach;with family/visitor present  OT Visit Diagnosis: Muscle weakness (generalized) (M62.81)                Time: 1330-1409 OT Time Calculation (min): 39 min Charges:  OT General Charges $OT Visit: 1 Visit OT Evaluation $OT Eval Low Complexity: 1 Low OT Treatments $Self Care/Home Management : 23-37 mins  Derl Barrow, OTR/L Acute Care Rehab Services  Office [phone]   Lenward Chancellor 02/22/2020, 5:10 PM

## 2020-02-22 NOTE — H&P (Signed)
Roy Galvan is an 64 y.o. male.   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  Pain interferes with sleep and quality of life.   Past Medical History:  Diagnosis Date  . Arthritis   . History of chicken pox   . History of COVID-19 08/2019  . HTN (hypertension)     Past Surgical History:  Procedure Laterality Date  . COLONOSCOPY  2011   WNL, rec rpt 10 yrs (Dr. Candace Cruise)    Family History  Problem Relation Age of Onset  . Hypertension Father   . Cancer Father        prostate  . CAD Father        MI vs CVA  . Hypertension Mother   . Diabetes Neg Hx    Social History:  reports that he has never smoked. He has never used smokeless tobacco. He reports that he does not drink alcohol or use drugs.  Allergies: No Known Allergies  Medications Prior to Admission  Medication Sig Dispense Refill  . amLODipine (NORVASC) 10 MG tablet Take 1 tablet (10 mg total) by mouth daily. 90 tablet 3  . aspirin 81 MG tablet Take 81 mg by mouth daily.    . enalapril (VASOTEC) 20 MG tablet Take 1 tablet (20 mg total) by mouth 2 (two) times daily. 180 tablet 3  . metoprolol succinate (TOPROL-XL) 50 MG 24 hr tablet Take 1 tablet (50 mg total) by mouth daily. Take with or immediately following a meal. 90 tablet 3  . spironolactone (ALDACTONE) 25 MG tablet Take 1 tablet (25 mg total) by mouth daily. 30 tablet 6    Results for orders placed or performed during the hospital encounter of 02/20/20 (from the past 48 hour(s))  SARS CORONAVIRUS 2 (TAT 6-24 HRS) Nasopharyngeal Nasopharyngeal Swab     Status: None   Collection Time: 02/20/20 10:28 AM   Specimen: Nasopharyngeal Swab  Result Value Ref Range   SARS Coronavirus 2 NEGATIVE NEGATIVE    Comment: (NOTE) SARS-CoV-2 target nucleic acids are NOT DETECTED. The SARS-CoV-2 RNA is generally detectable in upper and lower respiratory specimens during  the acute phase of infection. Negative results do not preclude SARS-CoV-2 infection, do not rule out co-infections with other pathogens, and should not be used as the sole basis for treatment or other patient management decisions. Negative results must be combined with clinical observations, patient history, and epidemiological information. The expected result is Negative. Fact Sheet for Patients: SugarRoll.be Fact Sheet for Healthcare Providers: https://www.woods-mathews.com/ This test is not yet approved or cleared by the [place] FDA and  has been authorized for detection and/or diagnosis of SARS-CoV-2 by FDA under an Emergency Use Authorization (EUA). This EUA will remain  in effect (meaning this test can be used) for the duration of the COVID-19 declaration under Section 56 4(b)(1) of the Act, 21 U.S.C. section 360bbb-3(b)(1), unless the authorization is terminated or revoked sooner. Performed at [facility], [place] [address] [place], [place] [address]    No results found.  Review of Systems  All other systems reviewed and are negative.   Blood pressure (!) 158/92, pulse 77, temperature 98.5 F (36.9 C), temperature source Oral, resp. rate 17, height 5\' 11"  (1.803 m), weight 89 kg, SpO2 100 %. Physical Exam  Constitutional: He is oriented to person, place, and time. He appears well-developed and well-nourished.  HENT:  Head: Atraumatic.  Eyes: EOM  are normal.  Cardiovascular: Intact distal pulses.  Respiratory: Effort normal.  Musculoskeletal:     Comments: R shoulder pain and limited ROM. NVID.  Neurological: He is alert and oriented to person, place, and time.  Skin: Skin is warm and dry.  Psychiatric: He has a normal mood and affect.     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

## 2020-02-22 NOTE — Discharge Instructions (Signed)
Discharge Instructions after Total Shoulder Arthroplasty   . A sling has been provided for you. Remove the sling 5 times each day to perform motion exercises. . Use ice on the shoulder intermittently over the first 48 hours after surgery.  . Pain medication has been prescribed for you.  . Use your medication liberally over the first 48 hours, and then begin to taper your use. You may take Extra Strength Tylenol or Tylenol only in place of the pain pills. DO NOT take ANY nonsteroidal anti-inflammatory pain medications: Advil, Motrin, Ibuprofen, Aleve, Naproxen, or Naprosyn. . Take one aspirin a day for 2 weeks after surgery, unless you have an aspirin sensitivity/allergy or asthma. . Leave your dressing on until your first follow up visit.  You may shower with the dressing.  Hold your arm as if you still have your sling on while you shower. . Active reaching and lifting are not permitted. You may use the operative arm for activities of daily living that do not require the operative arm to leave the side of the body, such as eating, drinking, bathing, etc.  . Three to 5 times each day you should perform assisted overhead reaching and external rotation (outward turning) exercises with the operative arm. You were taught these exercises prior to discharge. Both exercises should be done with the non-operative arm used as the "therapist arm" while the operative arm remains relaxed. Ten of each exercise should be done three to five times each day.   Overhead reach is helping to lift your stiff arm up as high as it will go. To stretch your overhead reach, lie flat on your back, relax, and grasp the wrist of the tight shoulder with your opposite hand. Using the power in your opposite arm, bring the stiff arm up as far as it is comfortable. Start holding it for ten seconds and then work up to where you can hold it for a count of 30. Breathe slowly and deeply while the arm is moved. Repeat this stretch ten times,  trying to help the ar up a little higher each time.     External rotation is turning the arm out to the side while your elbow stays close to your body. External rotation is best stretched while you are lying on your back. Hold a cane, yardstick, broom handle, or dowel in both hands. Bend both elbows to a right angle. Use steady, gentle force from your normal arm to rotate the hand of the stiff shoulder out away from your body. Continue the rotation until it is straight in front of you holding it there for a count of 10. Do not go beyond this level of rotation until seen back by Dr. Chandler. Repeat this exercise ten times slowly.      Please call [phone] during normal business hours or [phone] after hours for any problems. Including the following:  - excessive redness of the incisions - drainage for more than 4 days - fever of more than 101.5 F  *Please note that pain medications will not be refilled after hours or on weekends.  Dental Antibiotics:  In most cases prophylactic antibiotics for Dental procdeures after total joint surgery are not necessary.  Exceptions are as follows:  1. History of prior total joint infection  2. Severely immunocompromised (Organ Transplant, cancer chemotherapy, Rheumatoid biologic meds such as Humera)  3. Poorly controlled diabetes (A1C &gt; 8.0, blood glucose over 200)  If you have one of these conditions, contact your surgeon for   an antibiotic prescription, prior to your dental procedure.    

## 2020-02-22 NOTE — Op Note (Signed)
Procedure(s): TOTAL SHOULDER ARTHROPLASTY Procedure Note  Roy Galvan male 64 y.o. 02/22/2020   Preoperative diagnosis: Right shoulder end-stage osteoarthritis with B2 posterior glenoid wear  Postoperative diagnosis: Same  Procedure(s) and Anesthesia Type: Right TOTAL SHOULDER ARTHROPLASTY with augmented glenoid component- General  Surgeon(s) and Role:    Jones Broom, MD - Primary   Indications:  64 y.o. male  With endstage right shoulder arthritis. Pain and dysfunction interfered with quality of life and nonoperative treatment with activity modification, NSAIDS and injections failed.     Surgeon: Berline Lopes   Assistants: Damita Lack PA-C [facility] was present and scrubbed throughout the procedure and was essential in positioning, retraction, exposure, and closure)  Anesthesia: General endotracheal anesthesia with preoperative interscalene block given by the attending anesthesiologist     Procedure Detail  TOTAL SHOULDER ARTHROPLASTY  Findings: Tornier flex anatomic press-fit size 3 stem with a 52 x 19 head, cemented size medium +25 augmented performed plus Cortiloc glenoid.   A lesser tuberosity osteotomy was perform and repaired at the conclusion of the procedure.  Estimated Blood Loss:  200 mL         Drains: None   Blood Given: none          Specimens: none        Complications:  * No complications entered in OR log *         Disposition: PACU - hemodynamically stable.         Condition: stable    Procedure:   The patient was identified in the preoperative holding area where I personally marked the operative extremity after verifying with the patient and consent. He  was taken to the operating room where He was transferred to the   operative table.  The patient received an interscalene block in   the holding area by the attending anesthesiologist.  General anesthesia was induced   in the operating room without complication.  The  patient did receive IV  Ancef prior to the commencement of the procedure.  The patient was   placed in the beach-chair position with the back raised about 30   degrees.  The nonoperative extremity and head and neck were carefully   positioned and padded protecting against neurovascular compromise.  The   left upper extremity was then prepped and draped in the standard sterile   fashion.    The appropriate operative time-out was performed with   Anesthesia, the perioperative staff, as well as myself and we all agreed   that the right side was the correct operative site.  An approximately   10 cm incision was made from the tip of the coracoid to the center point of the   humerus at the level of the axilla.  Dissection was carried down sharply   through subcutaneous tissues and cephalic vein was identified and taken   laterally with the deltoid.  The pectoralis major was taken medially.  The   upper 1 cm of the pectoralis major was released from its attachment on   the humerus.  The clavipectoral fascia was incised just lateral to the   conjoined tendon.  This incision was carried up to but not into the   coracoacromial ligament.  Digital palpation was used to prove   integrity of the axillary nerve which was protected throughout the   procedure.  Musculocutaneous nerve was not palpated in the operative   field.  Conjoined tendon was then retracted gently medially and the  deltoid laterally.  Anterior circumflex humeral vessels were clamped and   coagulated.  The soft tissues overlying the biceps was incised and this   incision was carried across the transverse humeral ligament to the base   of the coracoid.  The biceps was noted to be severely degenerated. It was released from the superior labrum. The biceps was then tenodesed to the soft tissue just above   pectoralis major and the remaining portion of the biceps superiorly was   excised.  An osteotomy was performed at the lesser  tuberosity.  The capsule was then   released all the way down to the 6 o'clock position of the humeral head.   The humeral head was then delivered with simultaneous adduction,   extension and external rotation.  All humeral osteophytes were removed   and the anatomic neck of the humerus was marked and cut free hand at   approximately 25 degrees retroversion within about 3 mm of the cuff   reflection posteriorly.  The head size was estimated to be a 52 medium   offset.  At that point, the humeral head was retracted posteriorly with   a Fukuda retractor.   Remaining portion of the capsule was released at the base of the   coracoid.  The remaining biceps anchor and the entire anterior-inferior   labrum was excised.  The posterior labrum was also excised but the   posterior capsule was not released.  The glenoid was noted to be B2 morphology.  the guidepin was placed bicortically with perform plus anterior glenoid referencing guide.  The reamer was used to ream to the halfway point anteriorly.  The anterior reference hole was then drilled.  The posterior angled reamer was then used starting at 15 degrees advancing to 25 until the posterior half of the glenoid was appropriately reamed.  The checker was used to ensure appropriate concentric fit.  The peripheral holes were then drilled followed by the the center hole and none of the holes   exited the glenoid wall.  The trial was placed and felt to be an appropriate fit.  I then pulse irrigated these holes and dried   them with Surgicel.  The three peripheral holes were then   pressurized cemented and the anchor peg glenoid was placed and impacted   with an excellent fit.  The glenoid was a 25 medium perform plus component.  The proximal humerus was then again exposed taking care not to displace the glenoid.    The entry awl was used followed by sounding reamers and then sequentially broached from size 1 to 3. This was then left in place and the calcar  planer was used. Trial head was placed with a 52 x 19.  With the trial implantation of the component,  there was approximately 50% posterior translation with immediate snap back to the   anatomic position.  With forward elevation, there was no tendency   towards posterior subluxation.   The trial was removed and the final implant was prepared on a back table.  The trial was removed and the final implant was prepared on a back table.   3 small holes were drilled on the medial side of the lesser tuberosity osteotomy, through which 2 labral tapes were passed. The implant was then placed through the loop of the 2 labral tapes and impacted with an excellent press-fit. This achieved excellent anatomic reconstruction of the proximal humerus.  The joint was then copiously irrigated with pulse lavage.  The subscapularis and   lesser tuberosity osteotomy were then repaired using the 2 labral tapes previously passed in a double row fashion with horizontal mattress sutures medially brought over through bone tunnels tied over a bone bridge laterally.   One #1 Ethibond was placed at the rotator interval just above   the lesser tuberosity. Copious irrigation was used. Skin was closed with 2-0 Vicryl sutures in the deep dermal layer and 4-0 Monocryl in a subcuticular  running fashion.  Sterile dressings were then applied including Aquacel.  The patient was placed in a sling and allowed to awaken from general anesthesia and taken to the recovery room in stable condition.      POSTOPERATIVE PLAN:  Early passive range of motion will be allowed with the goal of 0 degrees external rotation and 90 degrees forward elevation.  No internal rotation at this time.  No active motion of the arm until the lesser tuberosity heals.  The patient will likely be kept in the hospital overnight for observation and then discharged home.

## 2020-02-22 NOTE — Progress Notes (Signed)
Assisted Dr. Ossey with right, ultrasound guided, interscalene  block. Side rails up, monitors on throughout procedure. See vital signs in flow sheet. Tolerated Procedure well. 

## 2020-02-23 DIAGNOSIS — I10 Essential (primary) hypertension: Secondary | ICD-10-CM | POA: Diagnosis not present

## 2020-02-23 DIAGNOSIS — Z7982 Long term (current) use of aspirin: Secondary | ICD-10-CM | POA: Diagnosis not present

## 2020-02-23 DIAGNOSIS — M19011 Primary osteoarthritis, right shoulder: Secondary | ICD-10-CM | POA: Diagnosis not present

## 2020-02-23 DIAGNOSIS — Z79899 Other long term (current) drug therapy: Secondary | ICD-10-CM | POA: Diagnosis not present

## 2020-02-23 DIAGNOSIS — Z8616 Personal history of COVID-19: Secondary | ICD-10-CM | POA: Diagnosis not present

## 2020-02-23 LAB — CBC
HCT: 39.8 % (ref 39.0–52.0)
Hemoglobin: 12.9 g/dL — ABNORMAL LOW (ref 13.0–17.0)
MCH: 30 pg (ref 26.0–34.0)
MCHC: 32.4 g/dL (ref 30.0–36.0)
MCV: 92.6 fL (ref 80.0–100.0)
Platelets: 260 10*3/uL (ref 150–400)
RBC: 4.3 MIL/uL (ref 4.22–5.81)
RDW: 13.2 % (ref 11.5–15.5)
WBC: 17.9 10*3/uL — ABNORMAL HIGH (ref 4.0–10.5)
nRBC: 0 % (ref 0.0–0.2)

## 2020-02-23 LAB — TROPONIN I (HIGH SENSITIVITY): Troponin I (High Sensitivity): 2 ng/L (ref <18)

## 2020-02-23 LAB — BASIC METABOLIC PANEL
Anion gap: 9 (ref 5–15)
BUN: 17 mg/dL (ref 8–23)
CO2: 22 mmol/L (ref 22–32)
Calcium: 8 mg/dL — ABNORMAL LOW (ref 8.9–10.3)
Chloride: 108 mmol/L (ref 98–111)
Creatinine, Ser: 0.87 mg/dL (ref 0.61–1.24)
GFR calc Af Amer: >60 mL/min (ref >60)
GFR calc non Af Amer: >60 mL/min (ref >60)
Glucose, Bld: 143 mg/dL — ABNORMAL HIGH (ref 70–99)
Potassium: 3.8 mmol/L (ref 3.5–5.1)
Sodium: 139 mmol/L (ref 135–145)

## 2020-02-23 MED ORDER — OXYCODONE-ACETAMINOPHEN 2.5-325 MG PO TABS: 1.0000 | ORAL_TABLET | ORAL | 0 refills | Status: DC | PRN

## 2020-02-23 MED ORDER — TIZANIDINE HCL 4 MG PO TABS
4.0000 mg | ORAL_TABLET | Freq: Three times a day (TID) | ORAL | 1 refills | Status: DC | PRN
Start: 2020-02-23 — End: 2020-07-23

## 2020-02-23 NOTE — Progress Notes (Signed)
   PATIENT ID: Roy Galvan   1 Day Post-Op Procedure(s) (LRB): TOTAL SHOULDER ARTHROPLASTY (Right)  Subjective: Doing well.  No pain.  Objective:  Vitals:   02/23/20 0120 02/23/20 0415  BP: 129/79 138/87  Pulse: 63 70  Resp: 18 15  Temp: 98.5 F (36.9 C) 98.4 F (36.9 C)  SpO2: 96% 98%     Right shoulder dressing clean dry and intact.  He is moving his hand well.  Labs:  Recent Labs    02/20/20 0919 02/23/20 0258  HGB 15.3 12.9*   Recent Labs    02/20/20 0919 02/23/20 0258  WBC 10.0 17.9*  RBC 5.11 4.30  HCT 46.2 39.8  PLT 294 260   Recent Labs    02/20/20 0919 02/23/20 0258  NA 142 139  K 4.0 3.8  CL 107 108  CO2 25 22  BUN 15 17  CREATININE 0.95 0.87  GLUCOSE 110* 143*  CALCIUM 8.7* 8.0*    Assessment and Plan:Postoperative day #1 status post right total shoulder arthroplasty OT for home exercise program Discharge home today with family, follow-up with me in 2 weeks

## 2020-02-23 NOTE — Plan of Care (Signed)

## 2020-02-24 NOTE — Discharge Summary (Addendum)
Patient ID: Roy Galvan MRN: [id number] DOB/AGE: [birth year] 64 y.o.  Admit date: 02/22/2020 Discharge date: 02/23/2020  Admission Diagnoses:  Active Problems:   Status post total shoulder arthroplasty, right   Discharge Diagnoses:  Same  Past Medical History:  Diagnosis Date   Arthritis    History of chicken pox    History of COVID-19 08/2019   HTN (hypertension)     Surgeries: Procedure(s): TOTAL SHOULDER ARTHROPLASTY on 02/22/2020   Consultants:   Discharged Condition: Improved  Hospital Course: MOHAMADOU MACIVER is an 64 y.o. male who was admitted 02/22/2020 for operative treatment of right shoulder OA. Patient has severe unremitting pain that affects sleep, daily activities, and work/hobbies. After pre-op clearance the patient was taken to the operating room on 02/22/2020 and underwent  Procedure(s): TOTAL SHOULDER ARTHROPLASTY.    Patient was given perioperative antibiotics:  Anti-infectives (From admission, onward)   Start     Dose/Rate Route Frequency Ordered Stop   02/22/20 1600  ceFAZolin (ANCEF) IVPB 2g/100 mL premix     2 g 200 mL/hr over 30 Minutes Intravenous Every 6 hours 02/22/20 1400 02/23/20 0531   02/22/20 0830  ceFAZolin (ANCEF) IVPB 2g/100 mL premix     2 g 200 mL/hr over 30 Minutes Intravenous On call to O.R. 02/22/20 3662 02/22/20 1045       Patient was given sequential compression devices, early ambulation, and asa to prevent DVT.  Patient benefited maximally from hospital stay and there were no complications.    Recent vital signs: No data found.   Recent laboratory studies:  Recent Labs    02/23/20 0258  WBC 17.9*  HGB 12.9*  HCT 39.8  PLT 260  NA 139  K 3.8  CL 108  CO2 22  BUN 17  CREATININE 0.87  GLUCOSE 143*  CALCIUM 8.0*     Discharge Medications:   Allergies as of 02/23/2020   No Known Allergies     Medication List    TAKE these medications   amLODipine 10 MG tablet Commonly known as: NORVASC Take 1 tablet (10 mg  total) by mouth daily.   aspirin 81 MG tablet Take 81 mg by mouth daily.   enalapril 20 MG tablet Commonly known as: VASOTEC Take 1 tablet (20 mg total) by mouth 2 (two) times daily.   metoprolol succinate 50 MG 24 hr tablet Commonly known as: TOPROL-XL Take 1 tablet (50 mg total) by mouth daily. Take with or immediately following a meal.   oxycodone-acetaminophen 2.5-325 MG tablet Commonly known as: Endocet Take 1-2 tablets by mouth every 4 (four) hours as needed for pain.   spironolactone 25 MG tablet Commonly known as: ALDACTONE Take 1 tablet (25 mg total) by mouth daily.   tiZANidine 4 MG tablet Commonly known as: Zanaflex Take 1 tablet (4 mg total) by mouth every 8 (eight) hours as needed for muscle spasms.       Diagnostic Studies: DG Chest 2 View  Result Date: 02/09/2020 CLINICAL DATA:  Preop evaluation. EXAM: CHEST - 2 VIEW COMPARISON:  None. FINDINGS: The cardiomediastinal contours are normal. Heart is normal in size. The lungs are clear. Pulmonary vasculature is normal. No consolidation, pleural effusion, or pneumothorax. No acute osseous abnormalities are seen. IMPRESSION: No acute or unexpected findings findings. Electronically Signed   By: Narda Rutherford M.D.   On: 02/09/2020 15:52   CT SHOULDER RIGHT WO CONTRAST  Result Date: 02/05/2020 CLINICAL DATA:  Chronic right shoulder pain and limited range of motion. No  known injury. EXAM: CT OF THE UPPER RIGHT EXTREMITY WITHOUT CONTRAST TECHNIQUE: Multidetector CT imaging of the upper right extremity was performed according to the standard protocol. COMPARISON:  None. FINDINGS: Bones/Joint/Cartilage The patient has severe glenohumeral osteoarthritis with marked joint space narrowing, flattening and remodeling of the glenoid and a large osteophyte off the humeral head. No notable subchondral cyst formation. A loose body subjacent to the coracoid process measures 2.7 cm craniocaudal by 1.3 cm AP by 2 cm transverse. A few  smaller loose bodies are also seen within the joint. There is moderate acromioclavicular osteoarthritis. The acromion is type 2. No acute bony or joint abnormality. No lytic or sclerotic lesion. Ligaments Suboptimally assessed by CT. Muscles and Tendons The rotator cuff appears intact. Musculature of the shoulder girdle is preserved. Soft tissues Imaged lung parenchyma is clear. IMPRESSION: Advanced glenohumeral osteoarthritis with loose bodies in the joint as described above. Mild acromioclavicular osteoarthritis. Electronically Signed   By: Inge Rise M.D.   On: 02/05/2020 14:25   DG Shoulder Right Port  Result Date: 02/22/2020 CLINICAL DATA:  Right shoulder arthroplasty EXAM: PORTABLE RIGHT SHOULDER COMPARISON:  02/05/2020 FINDINGS: Interval postsurgical changes from right total shoulder arthroplasty. Arthroplasty components appear in their expected alignment. No evidence of periprosthetic fracture or other complication. Expected postoperative changes within the overlying soft tissues. IMPRESSION: Interval right total shoulder arthroplasty without evidence of immediate postoperative complication. Electronically Signed   By: Davina Poke D.O.   On: 02/22/2020 14:08    Disposition: Discharge disposition: 01-Home or Self Care       Discharge Instructions    Call MD / Call 911   Complete by: As directed    If you experience chest pain or shortness of breath, CALL 911 and be transported to the hospital emergency room.  If you develope a fever above 101 F, pus (white drainage) or increased drainage or redness at the wound, or calf pain, call your surgeon's office.   Constipation Prevention   Complete by: As directed    Drink plenty of fluids.  Prune juice may be helpful.  You may use a stool softener, such as Colace (over the counter) 100 mg twice a day.  Use MiraLax (over the counter) for constipation as needed.   Diet - low sodium heart healthy   Complete by: As directed    Increase  activity slowly as tolerated   Complete by: As directed       Follow-up Information    Tania Ade, MD. Schedule an appointment as soon as possible for a visit in 2 weeks.   Specialty: Orthopedic Surgery Contact information: [address] [place] [address] [phone]        Schedule an appointment as soon as possible for a visit on 03/08/2020 to follow up.            Signed: Grier Mitts 02/24/2020, 4:14 PM

## 2020-02-26 ENCOUNTER — Encounter: Payer: Self-pay | Admitting: *Deleted

## 2020-03-06 DIAGNOSIS — Z471 Aftercare following joint replacement surgery: Secondary | ICD-10-CM | POA: Diagnosis not present

## 2020-03-06 DIAGNOSIS — Z96611 Presence of right artificial shoulder joint: Secondary | ICD-10-CM | POA: Diagnosis not present

## 2020-04-03 DIAGNOSIS — M25611 Stiffness of right shoulder, not elsewhere classified: Secondary | ICD-10-CM | POA: Diagnosis not present

## 2020-04-03 DIAGNOSIS — M25511 Pain in right shoulder: Secondary | ICD-10-CM | POA: Diagnosis not present

## 2020-04-03 DIAGNOSIS — Z471 Aftercare following joint replacement surgery: Secondary | ICD-10-CM | POA: Diagnosis not present

## 2020-04-03 DIAGNOSIS — R531 Weakness: Secondary | ICD-10-CM | POA: Diagnosis not present

## 2020-04-03 DIAGNOSIS — Z96611 Presence of right artificial shoulder joint: Secondary | ICD-10-CM | POA: Diagnosis not present

## 2020-04-05 DIAGNOSIS — M25511 Pain in right shoulder: Secondary | ICD-10-CM | POA: Diagnosis not present

## 2020-04-05 DIAGNOSIS — M25611 Stiffness of right shoulder, not elsewhere classified: Secondary | ICD-10-CM | POA: Diagnosis not present

## 2020-04-05 DIAGNOSIS — Z471 Aftercare following joint replacement surgery: Secondary | ICD-10-CM | POA: Diagnosis not present

## 2020-04-05 DIAGNOSIS — R531 Weakness: Secondary | ICD-10-CM | POA: Diagnosis not present

## 2020-04-08 DIAGNOSIS — R531 Weakness: Secondary | ICD-10-CM | POA: Diagnosis not present

## 2020-04-08 DIAGNOSIS — Z471 Aftercare following joint replacement surgery: Secondary | ICD-10-CM | POA: Diagnosis not present

## 2020-04-08 DIAGNOSIS — M25511 Pain in right shoulder: Secondary | ICD-10-CM | POA: Diagnosis not present

## 2020-04-08 DIAGNOSIS — M25611 Stiffness of right shoulder, not elsewhere classified: Secondary | ICD-10-CM | POA: Diagnosis not present

## 2020-04-10 DIAGNOSIS — M25511 Pain in right shoulder: Secondary | ICD-10-CM | POA: Diagnosis not present

## 2020-04-10 DIAGNOSIS — R531 Weakness: Secondary | ICD-10-CM | POA: Diagnosis not present

## 2020-04-10 DIAGNOSIS — M25611 Stiffness of right shoulder, not elsewhere classified: Secondary | ICD-10-CM | POA: Diagnosis not present

## 2020-04-10 DIAGNOSIS — Z471 Aftercare following joint replacement surgery: Secondary | ICD-10-CM | POA: Diagnosis not present

## 2020-04-15 DIAGNOSIS — M25611 Stiffness of right shoulder, not elsewhere classified: Secondary | ICD-10-CM | POA: Diagnosis not present

## 2020-04-15 DIAGNOSIS — Z471 Aftercare following joint replacement surgery: Secondary | ICD-10-CM | POA: Diagnosis not present

## 2020-04-15 DIAGNOSIS — R531 Weakness: Secondary | ICD-10-CM | POA: Diagnosis not present

## 2020-04-15 DIAGNOSIS — M25511 Pain in right shoulder: Secondary | ICD-10-CM | POA: Diagnosis not present

## 2020-04-17 DIAGNOSIS — M25611 Stiffness of right shoulder, not elsewhere classified: Secondary | ICD-10-CM | POA: Diagnosis not present

## 2020-04-17 DIAGNOSIS — R531 Weakness: Secondary | ICD-10-CM | POA: Diagnosis not present

## 2020-04-17 DIAGNOSIS — M25511 Pain in right shoulder: Secondary | ICD-10-CM | POA: Diagnosis not present

## 2020-04-17 DIAGNOSIS — Z471 Aftercare following joint replacement surgery: Secondary | ICD-10-CM | POA: Diagnosis not present

## 2020-04-22 DIAGNOSIS — M25611 Stiffness of right shoulder, not elsewhere classified: Secondary | ICD-10-CM | POA: Diagnosis not present

## 2020-04-22 DIAGNOSIS — Z471 Aftercare following joint replacement surgery: Secondary | ICD-10-CM | POA: Diagnosis not present

## 2020-04-22 DIAGNOSIS — M25511 Pain in right shoulder: Secondary | ICD-10-CM | POA: Diagnosis not present

## 2020-04-22 DIAGNOSIS — R531 Weakness: Secondary | ICD-10-CM | POA: Diagnosis not present

## 2020-04-24 DIAGNOSIS — R531 Weakness: Secondary | ICD-10-CM | POA: Diagnosis not present

## 2020-04-24 DIAGNOSIS — M25611 Stiffness of right shoulder, not elsewhere classified: Secondary | ICD-10-CM | POA: Diagnosis not present

## 2020-04-24 DIAGNOSIS — Z471 Aftercare following joint replacement surgery: Secondary | ICD-10-CM | POA: Diagnosis not present

## 2020-04-24 DIAGNOSIS — M25511 Pain in right shoulder: Secondary | ICD-10-CM | POA: Diagnosis not present

## 2020-04-29 DIAGNOSIS — M25511 Pain in right shoulder: Secondary | ICD-10-CM | POA: Diagnosis not present

## 2020-04-29 DIAGNOSIS — R531 Weakness: Secondary | ICD-10-CM | POA: Diagnosis not present

## 2020-04-29 DIAGNOSIS — Z471 Aftercare following joint replacement surgery: Secondary | ICD-10-CM | POA: Diagnosis not present

## 2020-04-29 DIAGNOSIS — M25611 Stiffness of right shoulder, not elsewhere classified: Secondary | ICD-10-CM | POA: Diagnosis not present

## 2020-05-06 DIAGNOSIS — M25511 Pain in right shoulder: Secondary | ICD-10-CM | POA: Diagnosis not present

## 2020-05-06 DIAGNOSIS — R531 Weakness: Secondary | ICD-10-CM | POA: Diagnosis not present

## 2020-05-06 DIAGNOSIS — M25611 Stiffness of right shoulder, not elsewhere classified: Secondary | ICD-10-CM | POA: Diagnosis not present

## 2020-05-06 DIAGNOSIS — Z471 Aftercare following joint replacement surgery: Secondary | ICD-10-CM | POA: Diagnosis not present

## 2020-05-08 DIAGNOSIS — Z471 Aftercare following joint replacement surgery: Secondary | ICD-10-CM | POA: Diagnosis not present

## 2020-05-08 DIAGNOSIS — M25511 Pain in right shoulder: Secondary | ICD-10-CM | POA: Diagnosis not present

## 2020-05-08 DIAGNOSIS — R531 Weakness: Secondary | ICD-10-CM | POA: Diagnosis not present

## 2020-05-08 DIAGNOSIS — M25611 Stiffness of right shoulder, not elsewhere classified: Secondary | ICD-10-CM | POA: Diagnosis not present

## 2020-05-13 ENCOUNTER — Telehealth: Payer: Self-pay | Admitting: Family Medicine

## 2020-05-13 NOTE — Telephone Encounter (Signed)
Katie called pt has be picking up both  maxzide and spironolactone  She is wanting to make sure pt is supposed to be taking both rx  The rx 02/12/20 stated spironolactone replaces maxzide.  But pt still has been picking up both rx  PLEASE ADVISE

## 2020-05-14 MED ORDER — ENALAPRIL MALEATE 20 MG PO TABS: 20.0000 mg | ORAL_TABLET | Freq: Two times a day (BID) | ORAL | 0 refills | Status: DC

## 2020-05-14 NOTE — Telephone Encounter (Signed)
Spoke with pharmacist at [facility], relaying Dr. Timoteo Expose message.  Verbalizes understanding stating pt recently picked up last refill for maxzide.  Told him I will inform pt not to stop and only take spironolactone.  Expresses his thanks.   Spoke with pt relaying Dr. Timoteo Expose message.  Pt verbalizes understanding and states he did not take any of the maxzide.  He remembered to only take spironolactone.  Also, pt requests refill for enalapril.  Informed a refill will be sent.    E-scribed refill.

## 2020-05-14 NOTE — Telephone Encounter (Signed)
He should be on spironolactone which should have replaced maxzide.

## 2020-05-14 NOTE — Addendum Note (Signed)
Addended by: Nanci Pina on: 05/14/2020 02:33 PM   Modules accepted: Orders

## 2020-05-29 DIAGNOSIS — M25511 Pain in right shoulder: Secondary | ICD-10-CM | POA: Diagnosis not present

## 2020-05-29 DIAGNOSIS — M25611 Stiffness of right shoulder, not elsewhere classified: Secondary | ICD-10-CM | POA: Diagnosis not present

## 2020-05-29 DIAGNOSIS — Z471 Aftercare following joint replacement surgery: Secondary | ICD-10-CM | POA: Diagnosis not present

## 2020-05-29 DIAGNOSIS — R531 Weakness: Secondary | ICD-10-CM | POA: Diagnosis not present

## 2020-06-03 DIAGNOSIS — R531 Weakness: Secondary | ICD-10-CM | POA: Diagnosis not present

## 2020-06-03 DIAGNOSIS — M25511 Pain in right shoulder: Secondary | ICD-10-CM | POA: Diagnosis not present

## 2020-06-03 DIAGNOSIS — M25611 Stiffness of right shoulder, not elsewhere classified: Secondary | ICD-10-CM | POA: Diagnosis not present

## 2020-06-03 DIAGNOSIS — Z471 Aftercare following joint replacement surgery: Secondary | ICD-10-CM | POA: Diagnosis not present

## 2020-06-05 DIAGNOSIS — M25511 Pain in right shoulder: Secondary | ICD-10-CM | POA: Diagnosis not present

## 2020-06-05 DIAGNOSIS — Z471 Aftercare following joint replacement surgery: Secondary | ICD-10-CM | POA: Diagnosis not present

## 2020-06-05 DIAGNOSIS — R531 Weakness: Secondary | ICD-10-CM | POA: Diagnosis not present

## 2020-06-05 DIAGNOSIS — M25611 Stiffness of right shoulder, not elsewhere classified: Secondary | ICD-10-CM | POA: Diagnosis not present

## 2020-06-10 DIAGNOSIS — M25511 Pain in right shoulder: Secondary | ICD-10-CM | POA: Diagnosis not present

## 2020-06-10 DIAGNOSIS — R531 Weakness: Secondary | ICD-10-CM | POA: Diagnosis not present

## 2020-06-10 DIAGNOSIS — Z471 Aftercare following joint replacement surgery: Secondary | ICD-10-CM | POA: Diagnosis not present

## 2020-06-10 DIAGNOSIS — M25611 Stiffness of right shoulder, not elsewhere classified: Secondary | ICD-10-CM | POA: Diagnosis not present

## 2020-06-12 DIAGNOSIS — Z471 Aftercare following joint replacement surgery: Secondary | ICD-10-CM | POA: Diagnosis not present

## 2020-06-12 DIAGNOSIS — M25511 Pain in right shoulder: Secondary | ICD-10-CM | POA: Diagnosis not present

## 2020-06-12 DIAGNOSIS — M25611 Stiffness of right shoulder, not elsewhere classified: Secondary | ICD-10-CM | POA: Diagnosis not present

## 2020-06-12 DIAGNOSIS — R531 Weakness: Secondary | ICD-10-CM | POA: Diagnosis not present

## 2020-06-17 DIAGNOSIS — M25511 Pain in right shoulder: Secondary | ICD-10-CM | POA: Diagnosis not present

## 2020-06-17 DIAGNOSIS — Z471 Aftercare following joint replacement surgery: Secondary | ICD-10-CM | POA: Diagnosis not present

## 2020-06-17 DIAGNOSIS — M25611 Stiffness of right shoulder, not elsewhere classified: Secondary | ICD-10-CM | POA: Diagnosis not present

## 2020-06-17 DIAGNOSIS — R531 Weakness: Secondary | ICD-10-CM | POA: Diagnosis not present

## 2020-06-19 DIAGNOSIS — M25611 Stiffness of right shoulder, not elsewhere classified: Secondary | ICD-10-CM | POA: Diagnosis not present

## 2020-06-19 DIAGNOSIS — M25511 Pain in right shoulder: Secondary | ICD-10-CM | POA: Diagnosis not present

## 2020-06-19 DIAGNOSIS — R531 Weakness: Secondary | ICD-10-CM | POA: Diagnosis not present

## 2020-06-19 DIAGNOSIS — Z471 Aftercare following joint replacement surgery: Secondary | ICD-10-CM | POA: Diagnosis not present

## 2020-06-24 DIAGNOSIS — Z471 Aftercare following joint replacement surgery: Secondary | ICD-10-CM | POA: Diagnosis not present

## 2020-06-24 DIAGNOSIS — R531 Weakness: Secondary | ICD-10-CM | POA: Diagnosis not present

## 2020-06-24 DIAGNOSIS — M25511 Pain in right shoulder: Secondary | ICD-10-CM | POA: Diagnosis not present

## 2020-06-24 DIAGNOSIS — M25611 Stiffness of right shoulder, not elsewhere classified: Secondary | ICD-10-CM | POA: Diagnosis not present

## 2020-06-26 DIAGNOSIS — M25611 Stiffness of right shoulder, not elsewhere classified: Secondary | ICD-10-CM | POA: Diagnosis not present

## 2020-06-26 DIAGNOSIS — Z471 Aftercare following joint replacement surgery: Secondary | ICD-10-CM | POA: Diagnosis not present

## 2020-06-26 DIAGNOSIS — M25511 Pain in right shoulder: Secondary | ICD-10-CM | POA: Diagnosis not present

## 2020-06-26 DIAGNOSIS — R531 Weakness: Secondary | ICD-10-CM | POA: Diagnosis not present

## 2020-06-27 ENCOUNTER — Other Ambulatory Visit: Payer: BC Managed Care – PPO

## 2020-07-01 ENCOUNTER — Encounter: Payer: BC Managed Care – PPO | Admitting: Family Medicine

## 2020-07-04 DIAGNOSIS — M25611 Stiffness of right shoulder, not elsewhere classified: Secondary | ICD-10-CM | POA: Diagnosis not present

## 2020-07-04 DIAGNOSIS — R531 Weakness: Secondary | ICD-10-CM | POA: Diagnosis not present

## 2020-07-04 DIAGNOSIS — M25511 Pain in right shoulder: Secondary | ICD-10-CM | POA: Diagnosis not present

## 2020-07-04 DIAGNOSIS — Z471 Aftercare following joint replacement surgery: Secondary | ICD-10-CM | POA: Diagnosis not present

## 2020-07-14 ENCOUNTER — Other Ambulatory Visit: Payer: Self-pay | Admitting: Family Medicine

## 2020-07-14 DIAGNOSIS — E785 Hyperlipidemia, unspecified: Secondary | ICD-10-CM

## 2020-07-14 DIAGNOSIS — Z125 Encounter for screening for malignant neoplasm of prostate: Secondary | ICD-10-CM

## 2020-07-16 ENCOUNTER — Other Ambulatory Visit: Payer: Self-pay

## 2020-07-16 ENCOUNTER — Other Ambulatory Visit (INDEPENDENT_AMBULATORY_CARE_PROVIDER_SITE_OTHER): Payer: BC Managed Care – PPO

## 2020-07-16 DIAGNOSIS — Z125 Encounter for screening for malignant neoplasm of prostate: Secondary | ICD-10-CM | POA: Diagnosis not present

## 2020-07-16 DIAGNOSIS — R531 Weakness: Secondary | ICD-10-CM | POA: Diagnosis not present

## 2020-07-16 DIAGNOSIS — E785 Hyperlipidemia, unspecified: Secondary | ICD-10-CM

## 2020-07-16 DIAGNOSIS — Z471 Aftercare following joint replacement surgery: Secondary | ICD-10-CM | POA: Diagnosis not present

## 2020-07-16 DIAGNOSIS — M25611 Stiffness of right shoulder, not elsewhere classified: Secondary | ICD-10-CM | POA: Diagnosis not present

## 2020-07-16 DIAGNOSIS — M25511 Pain in right shoulder: Secondary | ICD-10-CM | POA: Diagnosis not present

## 2020-07-16 LAB — COMPREHENSIVE METABOLIC PANEL
ALT: 16 U/L (ref 0–53)
AST: 15 U/L (ref 0–37)
Albumin: 4 g/dL (ref 3.5–5.2)
Alkaline Phosphatase: 78 U/L (ref 39–117)
BUN: 17 mg/dL (ref 6–23)
CO2: 29 mEq/L (ref 19–32)
Calcium: 8.8 mg/dL (ref 8.4–10.5)
Chloride: 103 mEq/L (ref 96–112)
Creatinine, Ser: 0.94 mg/dL (ref 0.40–1.50)
GFR: 86.82 mL/min (ref >60.00)
Glucose, Bld: 98 mg/dL (ref 70–99)
Potassium: 3.4 mEq/L — ABNORMAL LOW (ref 3.5–5.1)
Sodium: 142 mEq/L (ref 135–145)
Total Bilirubin: 0.4 mg/dL (ref 0.2–1.2)
Total Protein: 6.3 g/dL (ref 6.0–8.3)

## 2020-07-16 LAB — LIPID PANEL
Cholesterol: 181 mg/dL (ref 0–200)
HDL: 30.5 mg/dL — ABNORMAL LOW (ref >39.00)
NonHDL: 150.56
Total CHOL/HDL Ratio: 6
Triglycerides: 201 mg/dL — ABNORMAL HIGH (ref 0.0–149.0)
VLDL: 40.2 mg/dL — ABNORMAL HIGH (ref 0.0–40.0)

## 2020-07-16 LAB — PSA: PSA: 0.9 ng/mL (ref 0.10–4.00)

## 2020-07-16 LAB — LDL CHOLESTEROL, DIRECT: Direct LDL: 110 mg/dL

## 2020-07-23 ENCOUNTER — Encounter: Payer: Self-pay | Admitting: Family Medicine

## 2020-07-23 ENCOUNTER — Other Ambulatory Visit: Payer: Self-pay

## 2020-07-23 ENCOUNTER — Ambulatory Visit (INDEPENDENT_AMBULATORY_CARE_PROVIDER_SITE_OTHER): Payer: BC Managed Care – PPO | Admitting: Family Medicine

## 2020-07-23 VITALS — BP 150/82 | HR 72 | Temp 98.0°F | Ht 70.25 in | Wt 211.6 lb

## 2020-07-23 DIAGNOSIS — M25651 Stiffness of right hip, not elsewhere classified: Secondary | ICD-10-CM | POA: Diagnosis not present

## 2020-07-23 DIAGNOSIS — R531 Weakness: Secondary | ICD-10-CM | POA: Diagnosis not present

## 2020-07-23 DIAGNOSIS — E785 Hyperlipidemia, unspecified: Secondary | ICD-10-CM

## 2020-07-23 DIAGNOSIS — M25611 Stiffness of right shoulder, not elsewhere classified: Secondary | ICD-10-CM | POA: Diagnosis not present

## 2020-07-23 DIAGNOSIS — I10 Essential (primary) hypertension: Secondary | ICD-10-CM | POA: Diagnosis not present

## 2020-07-23 DIAGNOSIS — M25652 Stiffness of left hip, not elsewhere classified: Secondary | ICD-10-CM

## 2020-07-23 DIAGNOSIS — Z1211 Encounter for screening for malignant neoplasm of colon: Secondary | ICD-10-CM

## 2020-07-23 DIAGNOSIS — Z96611 Presence of right artificial shoulder joint: Secondary | ICD-10-CM

## 2020-07-23 DIAGNOSIS — Z471 Aftercare following joint replacement surgery: Secondary | ICD-10-CM | POA: Diagnosis not present

## 2020-07-23 DIAGNOSIS — M25511 Pain in right shoulder: Secondary | ICD-10-CM | POA: Diagnosis not present

## 2020-07-23 DIAGNOSIS — Z0000 Encounter for general adult medical examination without abnormal findings: Secondary | ICD-10-CM

## 2020-07-23 MED ORDER — AMLODIPINE BESYLATE 10 MG PO TABS: 10.0000 mg | ORAL_TABLET | Freq: Every day | ORAL | 3 refills | Status: DC

## 2020-07-23 MED ORDER — ENALAPRIL MALEATE 20 MG PO TABS: 20.0000 mg | ORAL_TABLET | Freq: Two times a day (BID) | ORAL | 3 refills | Status: DC

## 2020-07-23 MED ORDER — TRIAMTERENE-HCTZ 37.5-25 MG PO TABS: 1.0000 | ORAL_TABLET | Freq: Every day | ORAL | 3 refills | Status: DC

## 2020-07-23 MED ORDER — METOPROLOL SUCCINATE ER 50 MG PO TB24: 50.0000 mg | ORAL_TABLET | Freq: Every day | ORAL | 3 refills | Status: DC

## 2020-07-23 NOTE — Patient Instructions (Addendum)
Consider shingles vaccine shingrix.  Work on low cholesterol diet, with potassium [place] foods (fruits/vegetables).  Increase enalapril to twice daily dosing.  Keep an eye on blood pressure readings, let us know if consistently >140/90. Limit salt in the diet.  Return as needed or in 1 year for next physical.   Health Maintenance, Male Adopting a healthy lifestyle and getting preventive care are important in promoting health and wellness. Ask your health care provider about:  The right schedule for you to have regular tests and exams.  Things you can do on your own to prevent diseases and keep yourself healthy. What should I know about diet, weight, and exercise? Eat a healthy diet   Eat a diet that includes plenty of vegetables, fruits, low-fat dairy products, and lean protein.  Do not eat a lot of foods that are high in solid fats, added sugars, or sodium. Maintain a healthy weight Body mass index (BMI) is a measurement that can be used to identify possible weight problems. It estimates body fat based on height and weight. Your health care provider can help determine your BMI and help you achieve or maintain a healthy weight. Get regular exercise Get regular exercise. This is one of the most important things you can do for your health. Most adults should:  Exercise for at least 150 minutes each week. The exercise should increase your heart rate and make you sweat (moderate-intensity exercise).  Do strengthening exercises at least twice a week. This is in addition to the moderate-intensity exercise.  Spend less time sitting. Even light physical activity can be beneficial. Watch cholesterol and blood lipids Have your blood tested for lipids and cholesterol at 62 years of age, then have this test every 5 years. You may need to have your cholesterol levels checked more often if:  Your lipid or cholesterol levels are high.  You are older than 61 years of age.  You are at high risk for  heart disease. What should I know about cancer screening? Many types of cancers can be detected early and may often be prevented. Depending on your health history and family history, you may need to have cancer screening at various ages. This may include screening for:  Colorectal cancer.  Prostate cancer.  Skin cancer.  Lung cancer. What should I know about heart disease, diabetes, and high blood pressure? Blood pressure and heart disease  High blood pressure causes heart disease and increases the risk of stroke. This is more likely to develop in people who have high blood pressure readings, are of African descent, or are overweight.  Talk with your health care provider about your target blood pressure readings.  Have your blood pressure checked: ? Every 3-5 years if you are 29-69 years of age. ? Every year if you are 95 years old or older.  If you are between the ages of 46 and 65 and are a current or former smoker, ask your health care provider if you should have a one-time screening for abdominal aortic aneurysm (AAA). Diabetes Have regular diabetes screenings. This checks your fasting blood sugar level. Have the screening done:  Once every three years after age 63 if you are at a normal weight and have a low risk for diabetes.  More often and at a younger age if you are overweight or have a high risk for diabetes. What should I know about preventing infection? Hepatitis B If you have a higher risk for hepatitis B, you should be screened for  this virus. Talk with your health care provider to find out if you are at risk for hepatitis B infection. Hepatitis C Blood testing is recommended for:  Everyone born from 1945 through 1965.  Anyone with known risk factors for hepatitis C. Sexually transmitted infections (STIs)  You should be screened each year for STIs, including gonorrhea and chlamydia, if: ? You are sexually active and are younger than 62 years of age. ? You are  older than 62 years of age and your health care provider tells you that you are at risk for this type of infection. ? Your sexual activity has changed since you were last screened, and you are at increased risk for chlamydia or gonorrhea. Ask your health care provider if you are at risk.  Ask your health care provider about whether you are at high risk for HIV. Your health care provider may recommend a prescription medicine to help prevent HIV infection. If you choose to take medicine to prevent HIV, you should first get tested for HIV. You should then be tested every 3 months for as long as you are taking the medicine. Follow these instructions at home: Lifestyle  Do not use any products that contain nicotine or tobacco, such as cigarettes, e-cigarettes, and chewing tobacco. If you need help quitting, ask your health care provider.  Do not use street drugs.  Do not share needles.  Ask your health care provider for help if you need support or information about quitting drugs. Alcohol use  Do not drink alcohol if your health care provider tells you not to drink.  If you drink alcohol: ? Limit how much you have to 0-2 drinks a day. ? Be aware of how much alcohol is in your drink. In the [place] one drink equals one 12 oz bottle of beer (355 mL), one 5 oz glass of wine (148 mL), or one 1 oz glass of hard liquor (44 mL). General instructions  Schedule regular health, dental, and eye exams.  Stay current with your vaccines.  Tell your health care provider if: ? You often feel depressed. ? You have ever been abused or do not feel safe at home. Summary  Adopting a healthy lifestyle and getting preventive care are important in promoting health and wellness.  Follow your health care provider's instructions about healthy diet, exercising, and getting tested or screened for diseases.  Follow your health care provider's instructions on monitoring your cholesterol and blood pressure. This  information is not intended to replace advice given to you by your health care provider. Make sure you discuss any questions you have with your health care provider. Document Revised: 08/31/2018 Document Reviewed: 08/31/2018 Elsevier Patient Education  2020 [employer]vier Inc.  

## 2020-07-23 NOTE — Assessment & Plan Note (Addendum)
Has recovered well, now working on ROM.

## 2020-07-23 NOTE — Assessment & Plan Note (Signed)
Seeing ortho discussing hip replacement surgery ([facility])

## 2020-07-23 NOTE — Assessment & Plan Note (Addendum)
Chronic, mild off statin. Will not start at this time.  The 10-year ASCVD risk score Denman George DC Montez Hageman., et al., 2013) is: 19.3%   Values used to calculate the score:     Age: 64 years     Sex: Male     Is Non-Hispanic African American: No     Diabetic: No     Tobacco smoker: No     Systolic Blood Pressure: 150 mmHg     Is BP treated: Yes     HDL Cholesterol: 30.5 mg/dL     Total Cholesterol: 181 mg/dL

## 2020-07-23 NOTE — Assessment & Plan Note (Signed)
Preventative protocols reviewed and updated unless pt declined. Discussed healthy diet and lifestyle.  

## 2020-07-23 NOTE — Assessment & Plan Note (Signed)
BP elevated - he's only been taking enalapril once daily. Advised increase to BID dosing as prescribed, continue monitoring BP at home, let me know if consistently >140/90 for further med titration. Reviewed high potassium low sodium diet to help control blood pressures.  Did not tolerate spironolactone due to sexual dysfunction

## 2020-07-23 NOTE — Progress Notes (Signed)
This visit was conducted in person.  BP (!) 150/82 (BP Location: Left Arm, Patient Position: Sitting, Cuff Size: Normal)   Pulse 72   Temp 98 F (36.7 C) (Temporal)   Ht 5' 10.25" (1.784 m)   Wt 211 lb 9 oz (96 kg)   SpO2 95%   BMI 30.14 kg/m   On repeat 154/94 CC: CPE Subjective:    Patient ID: Roy Galvan, male    DOB: 01/30/1956, 64 y.o.   MRN: [id number]  HPI: Roy Galvan is a 64 y.o. male presenting on 07/23/2020 for Annual Exam   R shoulder replacement over the summer [facility]). 15 lb weight gain in interim.  Planning to undergo hip replacement ([place]). Injections didn't help.   HTN - compliant with amlodipine 10mg  daily, enalapril 20mg  bid, toprol XL 50mg  daily and spironolactone 25mg  daily. He just left physical therapy. Did not like spironolactone due to sexual dysfunction so he's back on maxzide. Has not been checking BP at home lately.   Preventative: COLONOSCOPY Date: 2011 WNL, rec rpt 10 yrs (Dr. Bluford Kaufmannh). Discussed options, would like iFOB. No fmhx colon cancer  Prostate cancer screening - father with h/o prostate cancer. Desires yearly screen.  Flu shot yearly - declines this year  Tetanus 2011, Tdap 04/2019  Pneumovax - not due Shingrix - discussed - will consider.  COVID vaccine - discussed, declines. Had COVID-19 illness 08/2019 (close exposure), didn't get tested.  Seat belt usediscussed  Sunscreen use discussed. 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 Interim medical history since our last visit reviewed. Allergies and medications reviewed and updated. Outpatient Medications  Prior to Visit  Medication Sig Dispense Refill  . aspirin 81 MG tablet Take 81 mg by mouth daily.    Marland Kitchen. amLODipine (NORVASC) 10 MG tablet Take 1 tablet (10 mg total) by mouth daily. 90 tablet 3  . enalapril (VASOTEC) 20 MG tablet Take 1 tablet (20 mg total) by mouth 2 (two) times daily. 180 tablet 0  . metoprolol succinate (TOPROL-XL) 50 MG 24 hr tablet Take 1 tablet (50 mg total) by mouth daily. Take with or immediately following a meal. 90 tablet 3  . spironolactone (ALDACTONE) 25 MG tablet Take 1 tablet (25 mg total) by mouth daily. 30 tablet 6  . oxycodone-acetaminophen (ENDOCET) 2.5-325 MG tablet Take 1-2 tablets by mouth every 4 (four) hours as needed for pain. 30 tablet 0  . tiZANidine (ZANAFLEX) 4 MG tablet Take 1 tablet (4 mg total) by mouth every 8 (eight) hours as needed for muscle spasms. 30 tablet 1   No facility-administered medications prior to visit.     Per HPI unless specifically indicated in ROS section below Review of Systems  Constitutional: Negative for activity change, appetite change, chills, fatigue, fever and unexpected weight change.  HENT: Negative for hearing loss.   Eyes: Negative for visual disturbance.  Respiratory: Negative for cough, chest tightness, shortness of breath and wheezing.   Cardiovascular: Negative for chest pain, palpitations and leg swelling.  Gastrointestinal: Negative for abdominal distention, abdominal pain, blood in stool, constipation, diarrhea,  nausea and vomiting.  Genitourinary: Negative for difficulty urinating and hematuria.  Musculoskeletal: Negative for arthralgias, myalgias and neck pain.  Skin: Negative for rash.  Neurological: Negative for dizziness, seizures, syncope and headaches.  Hematological: Negative for adenopathy. Does not bruise/bleed easily.  Psychiatric/Behavioral: Negative for dysphoric mood. The patient is not nervous/anxious.    Objective:  BP (!) 150/82 (BP Location: Left Arm, Patient Position: Sitting, Cuff  Size: Normal)   Pulse 72   Temp 98 F (36.7 C) (Temporal)   Ht 5' 10.25" (1.784 m)   Wt 211 lb 9 oz (96 kg)   SpO2 95%   BMI 30.14 kg/m   Wt Readings from Last 3 Encounters:  07/23/20 211 lb 9 oz (96 kg)  02/22/20 196 lb 4 oz (89 kg)  02/20/20 196 lb 4 oz (89 kg)      Physical Exam Vitals and nursing note reviewed.  Constitutional:      General: He is not in acute distress.    Appearance: Normal appearance. He is well-developed. He is not ill-appearing.  HENT:     Head: Normocephalic and atraumatic.     Right Ear: Hearing, tympanic membrane, ear canal and external ear normal.     Left Ear: Hearing, tympanic membrane, ear canal and external ear normal.  Eyes:     General: No scleral icterus.    Extraocular Movements: Extraocular movements intact.     Conjunctiva/sclera: Conjunctivae normal.     Pupils: Pupils are equal, round, and reactive to light.  Neck:     Thyroid: No thyroid mass or thyromegaly.     Vascular: No carotid bruit.  Cardiovascular:     Rate and Rhythm: Normal rate and regular rhythm.     Pulses: Normal pulses.          Radial pulses are 2+ on the right side and 2+ on the left side.     Heart sounds: Normal heart sounds. No murmur heard.   Pulmonary:     Effort: Pulmonary effort is normal. No respiratory distress.     Breath sounds: Normal breath sounds. No wheezing, rhonchi or rales.  Abdominal:     General: Abdomen is flat. Bowel sounds are normal. There is no distension.     Palpations: Abdomen is soft. There is no mass.     Tenderness: There is no abdominal tenderness. There is no guarding or rebound.     Hernia: No hernia is present.  Musculoskeletal:        General: Normal range of motion.     Cervical back: Normal range of motion and neck supple.     Right lower leg: No edema.     Left lower leg: No edema.  Lymphadenopathy:     Cervical: No cervical adenopathy.  Skin:    General: Skin is warm and dry.     Findings: No rash.  Neurological:       General: No focal deficit present.     Mental Status: He is alert and oriented to person, place, and time.     Comments: CN grossly intact, station and gait intact  Psychiatric:        Mood and Affect: Mood normal.        Behavior: Behavior normal.        Thought Content: Thought content normal.        Judgment: Judgment normal.       Results for orders placed or performed in visit on 07/16/20  PSA  Result Value  Ref Range   PSA 0.90 0.10 - 4.00 ng/mL  Comprehensive metabolic panel  Result Value Ref Range   Sodium 142 135 - 145 mEq/L   Potassium 3.4 (L) 3.5 - 5.1 mEq/L   Chloride 103 96 - 112 mEq/L   CO2 29 19 - 32 mEq/L   Glucose, Bld 98 70 - 99 mg/dL   BUN 17 6 - 23 mg/dL   Creatinine, Ser 6.96 0.40 - 1.50 mg/dL   Total Bilirubin 0.4 0.2 - 1.2 mg/dL   Alkaline Phosphatase 78 39 - 117 U/L   AST 15 0 - 37 U/L   ALT 16 0 - 53 U/L   Total Protein 6.3 6.0 - 8.3 g/dL   Albumin 4.0 3.5 - 5.2 g/dL   GFR 78.93 >81.01 mL/min   Calcium 8.8 8.4 - 10.5 mg/dL  Lipid panel  Result Value Ref Range   Cholesterol 181 0 - 200 mg/dL   Triglycerides 751.0 (H) 0 - 149 mg/dL   HDL 25.85 (L) >27.78 mg/dL   VLDL 24.2 (H) 0.0 - 35.3 mg/dL   Total CHOL/HDL Ratio 6    NonHDL 150.56   LDL cholesterol, direct  Result Value Ref Range   Direct LDL 110.0 mg/dL   Assessment & Plan:  This visit occurred during the SARS-CoV-2 public health emergency.  Safety protocols were in place, including screening questions prior to the visit, additional usage of staff PPE, and extensive cleaning of exam room while observing appropriate contact time as indicated for disinfecting solutions.   Problem List Items Addressed This Visit    Status post total shoulder arthroplasty, right    Has recovered well, now working on ROM.       HTN (hypertension)    BP elevated - he's only been taking enalapril once daily. Advised increase to BID dosing as prescribed, continue monitoring BP at home, let me know if  consistently >140/90 for further med titration. Reviewed high potassium low sodium diet to help control blood pressures.  Did not tolerate spironolactone due to sexual dysfunction      Relevant Medications   triamterene-hydrochlorothiazide (MAXZIDE-25) 37.5-25 MG tablet   amLODipine (NORVASC) 10 MG tablet   enalapril (VASOTEC) 20 MG tablet   metoprolol succinate (TOPROL-XL) 50 MG 24 hr tablet   Hip joint stiffness, bilateral    Seeing ortho discussing hip replacement surgery Yisroel Ramming)      Healthcare maintenance - Primary    Preventative protocols reviewed and updated unless pt declined. Discussed healthy diet and lifestyle.       Dyslipidemia    Chronic, mild off statin. Will not start at this time.  The 10-year ASCVD risk score Denman George DC Montez Hageman., et al., 2013) is: 19.3%   Values used to calculate the score:     Age: 6 years     Sex: Male     Is Non-Hispanic African American: No     Diabetic: No     Tobacco smoker: No     Systolic Blood Pressure: 150 mmHg     Is BP treated: Yes     HDL Cholesterol: 30.5 mg/dL     Total Cholesterol: 181 mg/dL        Other Visit Diagnoses    Special screening for malignant neoplasms, colon       Relevant Orders   Fecal occult blood, imunochemical       Meds ordered this encounter  Medications  . triamterene-hydrochlorothiazide (MAXZIDE-25) 37.5-25 MG tablet    Sig: Take 1 tablet by  mouth daily.    Dispense:  90 tablet    Refill:  3  . amLODipine (NORVASC) 10 MG tablet    Sig: Take 1 tablet (10 mg total) by mouth daily.    Dispense:  90 tablet    Refill:  3  . enalapril (VASOTEC) 20 MG tablet    Sig: Take 1 tablet (20 mg total) by mouth 2 (two) times daily.    Dispense:  180 tablet    Refill:  3  . metoprolol succinate (TOPROL-XL) 50 MG 24 hr tablet    Sig: Take 1 tablet (50 mg total) by mouth daily. Take with or immediately following a meal.    Dispense:  90 tablet    Refill:  3   Orders Placed This Encounter  Procedures  .  Fecal occult blood, imunochemical    Standing Status:   Future    Standing Expiration Date:   07/23/2021    Patient instructions: Consider shingles vaccine shingrix.  Work on low cholesterol diet, with potassium [place] foods (fruits/vegetables).  Increase enalapril to twice daily dosing.  Keep an eye on blood pressure readings, let us know if consistently >140/90. Limit salt in the diet.  Return as needed or in 1 year for next physical.   Follow up plan: Return in about 1 year (around 07/23/2021) for annual exam, prior fasting for blood work.  Eustaquio Boyden, MD

## 2020-07-31 DIAGNOSIS — M160 Bilateral primary osteoarthritis of hip: Secondary | ICD-10-CM | POA: Diagnosis not present

## 2020-09-09 ENCOUNTER — Telehealth: Payer: Self-pay | Admitting: Family Medicine

## 2020-09-09 NOTE — Telephone Encounter (Signed)
Spoke with pt relaying Dr. Timoteo Expose message.  Pt verbalizes understanding.  Says BP has been in 140s-150s/90s.  Denies any chest pain, tightness, dyspnea, dizziness or HA.  Scheduled pre-op exam on 09/16/20 at 9:00.

## 2020-09-09 NOTE — Telephone Encounter (Signed)
Received preop request for upcoming hip surgery next month.  BP has been elevated - would offer OV to recheck and can do preop at that time.  What are home BPs running? Any chest pain, tightness, dyspnea, dizziness, HA?

## 2020-09-16 ENCOUNTER — Encounter: Payer: Self-pay | Admitting: Family Medicine

## 2020-09-16 ENCOUNTER — Other Ambulatory Visit: Payer: Self-pay

## 2020-09-16 ENCOUNTER — Ambulatory Visit (INDEPENDENT_AMBULATORY_CARE_PROVIDER_SITE_OTHER): Payer: BC Managed Care – PPO | Admitting: Family Medicine

## 2020-09-16 VITALS — BP 152/94 | HR 77 | Temp 97.8°F | Ht 70.25 in | Wt 215.2 lb

## 2020-09-16 DIAGNOSIS — Z01818 Encounter for other preprocedural examination: Secondary | ICD-10-CM | POA: Diagnosis not present

## 2020-09-16 DIAGNOSIS — M160 Bilateral primary osteoarthritis of hip: Secondary | ICD-10-CM | POA: Diagnosis not present

## 2020-09-16 DIAGNOSIS — I10 Essential (primary) hypertension: Secondary | ICD-10-CM | POA: Diagnosis not present

## 2020-09-16 LAB — COMPREHENSIVE METABOLIC PANEL
ALT: 22 U/L (ref 0–53)
AST: 19 U/L (ref 0–37)
Albumin: 4.3 g/dL (ref 3.5–5.2)
Alkaline Phosphatase: 71 U/L (ref 39–117)
BUN: 13 mg/dL (ref 6–23)
CO2: 29 mEq/L (ref 19–32)
Calcium: 9 mg/dL (ref 8.4–10.5)
Chloride: 103 mEq/L (ref 96–112)
Creatinine, Ser: 0.9 mg/dL (ref 0.40–1.50)
GFR: 91.36 mL/min (ref >60.00)
Glucose, Bld: 90 mg/dL (ref 70–99)
Potassium: 3.5 mEq/L (ref 3.5–5.1)
Sodium: 140 mEq/L (ref 135–145)
Total Bilirubin: 0.6 mg/dL (ref 0.2–1.2)
Total Protein: 7.4 g/dL (ref 6.0–8.3)

## 2020-09-16 LAB — CBC WITH DIFFERENTIAL/PLATELET
Basophils Absolute: 0.1 10*3/uL (ref 0.0–0.1)
Basophils Relative: 0.7 % (ref 0.0–3.0)
Eosinophils Absolute: 0.3 10*3/uL (ref 0.0–0.7)
Eosinophils Relative: 2.8 % (ref 0.0–5.0)
HCT: 45.1 % (ref 39.0–52.0)
Hemoglobin: 15.2 g/dL (ref 13.0–17.0)
Lymphocytes Relative: 22.4 % (ref 12.0–46.0)
Lymphs Abs: 2.5 10*3/uL (ref 0.7–4.0)
MCHC: 33.7 g/dL (ref 30.0–36.0)
MCV: 88.5 fl (ref 78.0–100.0)
Monocytes Absolute: 0.7 10*3/uL (ref 0.1–1.0)
Monocytes Relative: 6.1 % (ref 3.0–12.0)
Neutro Abs: 7.6 10*3/uL (ref 1.4–7.7)
Neutrophils Relative %: 68 % (ref 43.0–77.0)
Platelets: 313 10*3/uL (ref 150.0–400.0)
RBC: 5.1 Mil/uL (ref 4.22–5.81)
RDW: 13.3 % (ref 11.5–15.5)
WBC: 11.1 10*3/uL — ABNORMAL HIGH (ref 4.0–10.5)

## 2020-09-16 LAB — PROTIME-INR
INR: 1 ratio (ref 0.8–1.0)
Prothrombin Time: 11.3 s (ref 9.6–13.1)

## 2020-09-16 LAB — BRAIN NATRIURETIC PEPTIDE: Pro B Natriuretic peptide (BNP): 68 pg/mL (ref 0.0–100.0)

## 2020-09-16 NOTE — Assessment & Plan Note (Addendum)
Upcoming R hip replacement surgery.  Appreciate ortho care.  Poor pain control despite tylenol 500mg  bid. Discussed using left over pain medication from shoulder replacement which he has previously tried with benefit.

## 2020-09-16 NOTE — Assessment & Plan Note (Addendum)
RCRI = 0. Check EKG, labs today.  Assuming all reassuring, anticipate adequately low risk to proceed with planned surgery.  No known personal h/o CAD, CVA - ok to stop aspirin 7d prior to surgery if deemed necessary by ortho.

## 2020-09-16 NOTE — Assessment & Plan Note (Addendum)
BP elevation noted despite 4 drug regimen - attributed to increased pain. He also forgot to take maxzide this morning.  Discussed pain control.  He will continue to monitor at home and let me know if consistently elevated despite better BP control.  Consider rpt aldosterone levels and renal artery [place] for further evaluation.

## 2020-09-16 NOTE — Patient Instructions (Signed)
Blood pressure remains elevated Start working on diet for goal weight loss which will help bp control. Continue watching at home, let me know if trending up to increase metoprolol dose.  Labs today - we will forward results to Dr Jerl Santos.

## 2020-09-16 NOTE — Progress Notes (Addendum)
Patient ID: Roy Galvan, male    DOB: [birth year], 64 y.o.   MRN: [id number]  This visit was conducted in person.  BP (!) 152/94 (BP Location: Right Arm, Cuff Size: Large)   Pulse 77   Temp 97.8 F (36.6 C) (Temporal)   Ht 5' 10.25" (1.784 m)   Wt 215 lb 3 oz (97.6 kg)   SpO2 95%   BMI 30.66 kg/m   BP Readings from Last 3 Encounters:  09/16/20 (!) 152/94  07/23/20 (!) 150/82  02/23/20 (!) 153/97   Pulse Readings from Last 3 Encounters:  09/16/20 77  07/23/20 72  02/23/20 72   CC: preop evaluation Subjective:   HPI: Roy Galvan is a 64 y.o. male presenting on 09/16/2020 for Pre-op Exam (Left hip total replacement scheduled on 10/03/20. )   Upcoming R hip replacement scheduled 10/03/2020 (Dr Jerl Santos) under spinal anesthesia. Lab work requested will be ordered today. Hip pain is activity limiting. Actually L hip is hurting more than right side - will check in with ortho about this. Managing pain with tylenol 500mg  BID. Discussed using left-over shoulder surgery pain med for better pain control in interim.   HTN - BP remaining elevated despite amlodipine 10mg , enalapril 20mg  bid, maxzide 37.5/25mg  daily and toprol XL 50mg  daily. Attributes increasing blood pressures to ongoing hip pain and poor sleep due to hip pain. Home readings 140-150 systolic.  BP Readings from Last 3 Encounters:  09/16/20 (!) 152/94  07/23/20 (!) 150/82  02/23/20 (!) 153/97    Denies chest pain, tightness, dyspnea, dizziness or palpitations.  Has tolerated GETA well, latest R shoulder replacement 02/2020.  No post op nausea/vomiting, no trouble waking up from surgery.        Relevant past medical, surgical, family and social history reviewed and updated as indicated. Interim medical history since our last visit reviewed. Allergies and medications reviewed and updated. Outpatient Medications Prior to Visit  Medication Sig Dispense Refill  . amLODipine (NORVASC) 10 MG tablet Take 1 tablet (10  mg total) by mouth daily. 90 tablet 3  . aspirin 81 MG tablet Take 81 mg by mouth daily.    . enalapril (VASOTEC) 20 MG tablet Take 1 tablet (20 mg total) by mouth 2 (two) times daily. 180 tablet 3  . triamterene-hydrochlorothiazide (MAXZIDE-25) 37.5-25 MG tablet Take 1 tablet by mouth daily. 90 tablet 3  . acetaminophen (TYLENOL) 500 MG tablet Take 1 tablet (500 mg total) by mouth in the morning and at bedtime.    . metoprolol succinate (TOPROL-XL) 50 MG 24 hr tablet Take 1 tablet (50 mg total) by mouth daily. Take with or immediately following a meal. (Patient not taking: Reported on 09/16/2020) 90 tablet 3   No facility-administered medications prior to visit.     Per HPI unless specifically indicated in ROS section below Review of Systems Objective:  BP (!) 152/94 (BP Location: Right Arm, Cuff Size: Large)   Pulse 77   Temp 97.8 F (36.6 C) (Temporal)   Ht 5' 10.25" (1.784 m)   Wt 215 lb 3 oz (97.6 kg)   SpO2 95%   BMI 30.66 kg/m   Wt Readings from Last 3 Encounters:  09/16/20 215 lb 3 oz (97.6 kg)  07/23/20 211 lb 9 oz (96 kg)  02/22/20 196 lb 4 oz (89 kg)      Physical Exam Vitals and nursing note reviewed.  Constitutional:      Appearance: Normal appearance. He is not ill-appearing.  Cardiovascular:     Rate and Rhythm: Normal rate and regular rhythm.     Pulses: Normal pulses.     Heart sounds: Normal heart sounds. No murmur heard.   Pulmonary:     Effort: Pulmonary effort is normal. No respiratory distress.     Breath sounds: Normal breath sounds. No wheezing, rhonchi or rales.  Musculoskeletal:     Right lower leg: Edema (tr) present.     Left lower leg: Edema (tr) present.  Neurological:     Mental Status: He is alert.  Psychiatric:        Mood and Affect: Mood normal.        Behavior: Behavior normal.       Results for orders placed or performed in visit on 09/16/20  Comprehensive metabolic panel  Result Value Ref Range   Sodium 140 135 - 145 mEq/L    Potassium 3.5 3.5 - 5.1 mEq/L   Chloride 103 96 - 112 mEq/L   CO2 29 19 - 32 mEq/L   Glucose, Bld 90 70 - 99 mg/dL   BUN 13 6 - 23 mg/dL   Creatinine, Ser 4.46 0.40 - 1.50 mg/dL   Total Bilirubin 0.6 0.2 - 1.2 mg/dL   Alkaline Phosphatase 71 39 - 117 U/L   AST 19 0 - 37 U/L   ALT 22 0 - 53 U/L   Total Protein 7.4 6.0 - 8.3 g/dL   Albumin 4.3 3.5 - 5.2 g/dL   GFR 28.63 >81.77 mL/min   Calcium 9.0 8.4 - 10.5 mg/dL  CBC with Differential/Platelet  Result Value Ref Range   WBC 11.1 (H) 4.0 - 10.5 K/uL   RBC 5.10 4.22 - 5.81 Mil/uL   Hemoglobin 15.2 13.0 - 17.0 g/dL   HCT 11.6 57.9 - 03.8 %   MCV 88.5 78.0 - 100.0 fl   MCHC 33.7 30.0 - 36.0 g/dL   RDW 33.3 83.2 - 91.9 %   Platelets 313.0 150.0 - 400.0 K/uL   Neutrophils Relative % 68.0 43.0 - 77.0 %   Lymphocytes Relative 22.4 12.0 - 46.0 %   Monocytes Relative 6.1 3.0 - 12.0 %   Eosinophils Relative 2.8 0.0 - 5.0 %   Basophils Relative 0.7 0.0 - 3.0 %   Neutro Abs 7.6 1.4 - 7.7 K/uL   Lymphs Abs 2.5 0.7 - 4.0 K/uL   Monocytes Absolute 0.7 0.1 - 1.0 K/uL   Eosinophils Absolute 0.3 0.0 - 0.7 K/uL   Basophils Absolute 0.1 0.0 - 0.1 K/uL  Protime-INR  Result Value Ref Range   INR 1.0 0.8 - 1.0 ratio   Prothrombin Time 11.3 9.6 - 13.1 sec  Brain natriuretic peptide  Result Value Ref Range   Pro B Natriuretic peptide (BNP) 68.0 0.0 - 100.0 pg/mL   CHEST - 2 VIEW COMPARISON:  None. FINDINGS: The cardiomediastinal contours are normal. Heart is normal in size. The lungs are clear. Pulmonary vasculature is normal. No consolidation, pleural effusion, or pneumothorax. No acute osseous abnormalities are seen. IMPRESSION: No acute or unexpected findings findings. Electronically Signed   By: Narda Rutherford M.D.   On: 02/09/2020 15:52  EKG - NSR rate 70, normal axis, intervals, no acute ST/T changes Assessment & Plan:  This visit occurred during the SARS-CoV-2 public health emergency.  Safety protocols were in place,  including screening questions prior to the visit, additional usage of staff PPE, and extensive cleaning of exam room while observing appropriate contact time as indicated for disinfecting solutions.  Problem List Items Addressed This Visit    Resistant hypertension    BP elevation noted despite 4 drug regimen - attributed to increased pain. He also forgot to take maxzide this morning.  Discussed pain control.  He will continue to monitor at home and let me know if consistently elevated despite better BP control.  Consider rpt aldosterone levels and renal artery [place] for further evaluation.       Pre-op evaluation - Primary    RCRI = 0. Check EKG, labs today.  Assuming all reassuring, anticipate adequately low risk to proceed with planned surgery.  No known personal h/o CAD, CVA - ok to stop aspirin 7d prior to surgery if deemed necessary by ortho.       Relevant Orders   EKG 12-Lead (Completed)   Comprehensive metabolic panel (Completed)   CBC with Differential/Platelet (Completed)   Protime-INR (Completed)   Brain natriuretic peptide (Completed)   Bilateral primary osteoarthritis of hip    Upcoming R hip replacement surgery.  Appreciate ortho care.  Poor pain control despite tylenol 500mg  bid. Discussed using left over pain medication from shoulder replacement which he has previously tried with benefit.       Relevant Medications   acetaminophen (TYLENOL) 500 MG tablet       No orders of the defined types were placed in this encounter.  Orders Placed This Encounter  Procedures  . Comprehensive metabolic panel  . CBC with Differential/Platelet  . Protime-INR  . Brain natriuretic peptide  . EKG 12-Lead    Patient Instructions  Blood pressure remains elevated Start working on diet for goal weight loss which will help bp control. Continue watching at home, let me know if trending up to increase metoprolol dose.  Labs today - we will forward results to Dr .     Follow up plan: Return if symptoms worsen or fail to improve.  Jerl Santos, MD

## 2020-09-21 HISTORY — PX: TOTAL HIP ARTHROPLASTY: SHX124

## 2020-09-30 DIAGNOSIS — M160 Bilateral primary osteoarthritis of hip: Secondary | ICD-10-CM | POA: Diagnosis not present

## 2020-10-03 DIAGNOSIS — M1611 Unilateral primary osteoarthritis, right hip: Secondary | ICD-10-CM | POA: Diagnosis not present

## 2020-10-03 DIAGNOSIS — M1612 Unilateral primary osteoarthritis, left hip: Secondary | ICD-10-CM | POA: Diagnosis not present

## 2020-11-13 ENCOUNTER — Telehealth: Payer: Self-pay | Admitting: Family Medicine

## 2020-11-13 DIAGNOSIS — Z01818 Encounter for other preprocedural examination: Secondary | ICD-10-CM

## 2020-11-18 DIAGNOSIS — M1611 Unilateral primary osteoarthritis, right hip: Secondary | ICD-10-CM | POA: Diagnosis not present

## 2020-11-18 NOTE — Telephone Encounter (Signed)
Pt had preop visit 09/16/2020 with labs done then. Outpatient surgery center requires labs to be done within 30 days of surgery - I was unaware.  I have ordered labs to be done again.  plz also get update on recent BP readings.

## 2020-11-18 NOTE — Telephone Encounter (Signed)
Patient is schedule for surgery. Lurena Joiner from [facility] sent a lab request over for [place] to do for the Patient. Patient is scheduled for labwork on 11/21/20.

## 2020-11-19 HISTORY — PX: TOTAL HIP ARTHROPLASTY: SHX124

## 2020-11-19 NOTE — Telephone Encounter (Signed)
Spoke with pt asking about recent BP readings.  Says he usually checks it in the evenings and it's been running 140s/88-90.

## 2020-11-21 ENCOUNTER — Other Ambulatory Visit (INDEPENDENT_AMBULATORY_CARE_PROVIDER_SITE_OTHER): Payer: BC Managed Care – PPO

## 2020-11-21 ENCOUNTER — Other Ambulatory Visit: Payer: Self-pay

## 2020-11-21 DIAGNOSIS — Z01818 Encounter for other preprocedural examination: Secondary | ICD-10-CM

## 2020-11-21 LAB — BASIC METABOLIC PANEL
BUN: 16 mg/dL (ref 6–23)
CO2: 29 mEq/L (ref 19–32)
Calcium: 9.1 mg/dL (ref 8.4–10.5)
Chloride: 104 mEq/L (ref 96–112)
Creatinine, Ser: 1 mg/dL (ref 0.40–1.50)
GFR: 80.41 mL/min (ref >60.00)
Glucose, Bld: 103 mg/dL — ABNORMAL HIGH (ref 70–99)
Potassium: 3.4 mEq/L — ABNORMAL LOW (ref 3.5–5.1)
Sodium: 141 mEq/L (ref 135–145)

## 2020-11-21 LAB — CBC WITH DIFFERENTIAL/PLATELET
Basophils Absolute: 0.1 10*3/uL (ref 0.0–0.1)
Basophils Relative: 1 % (ref 0.0–3.0)
Eosinophils Absolute: 0.3 10*3/uL (ref 0.0–0.7)
Eosinophils Relative: 2.8 % (ref 0.0–5.0)
HCT: 40.7 % (ref 39.0–52.0)
Hemoglobin: 13.6 g/dL (ref 13.0–17.0)
Lymphocytes Relative: 31.1 % (ref 12.0–46.0)
Lymphs Abs: 3 10*3/uL (ref 0.7–4.0)
MCHC: 33.4 g/dL (ref 30.0–36.0)
MCV: 87.5 fl (ref 78.0–100.0)
Monocytes Absolute: 0.6 10*3/uL (ref 0.1–1.0)
Monocytes Relative: 6.6 % (ref 3.0–12.0)
Neutro Abs: 5.6 10*3/uL (ref 1.4–7.7)
Neutrophils Relative %: 58.5 % (ref 43.0–77.0)
Platelets: 347 10*3/uL (ref 150.0–400.0)
RBC: 4.66 Mil/uL (ref 4.22–5.81)
RDW: 13.6 % (ref 11.5–15.5)
WBC: 9.6 10*3/uL (ref 4.0–10.5)

## 2020-11-21 LAB — PROTIME-INR
INR: 1 ratio (ref 0.8–1.0)
Prothrombin Time: 11.1 s (ref 9.6–13.1)

## 2020-11-22 ENCOUNTER — Other Ambulatory Visit: Payer: Self-pay | Admitting: Family Medicine

## 2020-11-22 MED ORDER — POTASSIUM 99 MG PO TABS: 1.0000 | ORAL_TABLET | Freq: Every day | ORAL | Status: DC

## 2020-11-22 MED ORDER — POTASSIUM CHLORIDE ER 10 MEQ PO TBCR: 10.0000 meq | EXTENDED_RELEASE_TABLET | Freq: Every day | ORAL | 0 refills | Status: DC

## 2020-11-28 DIAGNOSIS — M1611 Unilateral primary osteoarthritis, right hip: Secondary | ICD-10-CM | POA: Diagnosis not present

## 2020-12-25 DIAGNOSIS — Z9889 Other specified postprocedural states: Secondary | ICD-10-CM | POA: Diagnosis not present

## 2020-12-25 DIAGNOSIS — Z96642 Presence of left artificial hip joint: Secondary | ICD-10-CM | POA: Diagnosis not present

## 2021-01-22 ENCOUNTER — Other Ambulatory Visit: Payer: Self-pay

## 2021-01-22 ENCOUNTER — Ambulatory Visit (INDEPENDENT_AMBULATORY_CARE_PROVIDER_SITE_OTHER): Payer: BC Managed Care – PPO | Admitting: Family Medicine

## 2021-01-22 ENCOUNTER — Encounter: Payer: Self-pay | Admitting: Family Medicine

## 2021-01-22 VITALS — BP 136/84 | HR 77 | Temp 98.3°F | Ht 70.25 in | Wt 233.2 lb

## 2021-01-22 DIAGNOSIS — Z96643 Presence of artificial hip joint, bilateral: Secondary | ICD-10-CM | POA: Diagnosis not present

## 2021-01-22 DIAGNOSIS — N5089 Other specified disorders of the male genital organs: Secondary | ICD-10-CM | POA: Diagnosis not present

## 2021-01-22 DIAGNOSIS — K4090 Unilateral inguinal hernia, without obstruction or gangrene, not specified as recurrent: Secondary | ICD-10-CM | POA: Insufficient documentation

## 2021-01-22 NOTE — Assessment & Plan Note (Signed)
Exam most consistent with large R hydrocele however given swelling also present to R groin will need to r/o inguinal hernia. Currently asxs, no signs of strangulation. Will proceed with scrotal US and then determine need for gen surg vs urology. Pt agrees with plan. To let us know right away if any new pain or symptoms develop

## 2021-01-22 NOTE — Progress Notes (Signed)
Patient ID: Roy Galvan, male    DOB: 02-28-1956, 65 y.o.   MRN: [id number]  This visit was conducted in person.  BP 136/84   Pulse 77   Temp 98.3 F (36.8 C) (Temporal)   Ht 5' 10.25" (1.784 m)   Wt 233 lb 4 oz (105.8 kg)   SpO2 95%   BMI 33.23 kg/m    CC: R groin pain/swelling  Subjective:   HPI: Roy Galvan is a 66 y.o. male presenting on 01/22/2021 for Groin Pain (C/o right groin pain and swelling.  Started after R hip surgery, about 2 mos ago. )   Recent L hip replacement (09/2020) then R hip replacement surgery (11/2020) ([facility]) - recovering well. Seen last week by ortho, good report. Since surgery notes some swelling to R groin, unsure if present prior. Not painful or tender. No recent straining although he did have some constipation after surgery, but no recent heavy lifting. No prior h/o hernia.  No abd pain, bowel changes, fevers, dysuria or other UTI sxs.      Relevant past medical, surgical, family and social history reviewed and updated as indicated. Interim medical history since our last visit reviewed. Allergies and medications reviewed and updated. Outpatient Medications Prior to Visit  Medication Sig Dispense Refill  . acetaminophen (TYLENOL) 500 MG tablet Take 1 tablet (500 mg total) by mouth in the morning and at bedtime.    Marland Kitchen amLODipine (NORVASC) 10 MG tablet Take 1 tablet (10 mg total) by mouth daily. 90 tablet 3  . aspirin 81 MG tablet Take 81 mg by mouth daily.    . enalapril (VASOTEC) 20 MG tablet Take 1 tablet (20 mg total) by mouth 2 (two) times daily. 180 tablet 3  . metoprolol succinate (TOPROL-XL) 50 MG 24 hr tablet Take 1 tablet (50 mg total) by mouth daily. Take with or immediately following a meal. 90 tablet 3  . Potassium 99 MG TABS Take 1 tablet (99 mg total) by mouth daily.    Marland Kitchen triamterene-hydrochlorothiazide (MAXZIDE-25) 37.5-25 MG tablet Take 1 tablet by mouth daily. 90 tablet 3  . potassium chloride (KLOR-CON) 10 MEQ tablet Take 1  tablet (10 mEq total) by mouth daily for 3 days. 3 tablet 0   No facility-administered medications prior to visit.     Per HPI unless specifically indicated in ROS section below Review of Systems Objective:  BP 136/84   Pulse 77   Temp 98.3 F (36.8 C) (Temporal)   Ht 5' 10.25" (1.784 m)   Wt 233 lb 4 oz (105.8 kg)   SpO2 95%   BMI 33.23 kg/m   Wt Readings from Last 3 Encounters:  01/22/21 233 lb 4 oz (105.8 kg)  09/16/20 215 lb 3 oz (97.6 kg)  07/23/20 211 lb 9 oz (96 kg)      Physical Exam Vitals and nursing note reviewed.  Constitutional:      Appearance: Normal appearance. He is not ill-appearing.  Abdominal:     General: Bowel sounds are normal. There is no distension.     Palpations: Abdomen is soft. There is no mass.     Tenderness: There is no abdominal tenderness. There is no right CVA tenderness, left CVA tenderness, guarding or rebound.     Hernia: A hernia is present. Hernia is present in the right inguinal area. There is no hernia in the left inguinal area.  Genitourinary:    Penis: Normal and circumcised.      Testes:  Right: Swelling present. Mass or tenderness not present.        Left: Mass, tenderness, swelling or testicular hydrocele not present.     Epididymis:     Right: Normal.     Left: Normal.     Comments: Large mass to R scrotum separate from testicle, no change with cough or strain. Swelling into R inguinal region.  Lymphadenopathy:     Lower Body: No right inguinal adenopathy. No left inguinal adenopathy.  Neurological:     Mental Status: He is alert.  Psychiatric:        Mood and Affect: Mood normal.        Behavior: Behavior normal.       Assessment & Plan:  This visit occurred during the SARS-CoV-2 public health emergency.  Safety protocols were in place, including screening questions prior to the visit, additional usage of staff PPE, and extensive cleaning of exam room while observing appropriate contact time as indicated for  disinfecting solutions.   Problem List Items Addressed This Visit    Scrotal swelling - Primary    Exam most consistent with large R hydrocele however given swelling also present to R groin will need to r/o inguinal hernia. Currently asxs, no signs of strangulation. Will proceed with scrotal US and then determine need for gen surg vs urology. Pt agrees with plan. To let us know right away if any new pain or symptoms develop       Relevant Orders   US Scrotum   S/P hip replacement, bilateral       No orders of the defined types were placed in this encounter.  Orders Placed This Encounter  Procedures  . US Scrotum    Standing Status:   Future    Standing Expiration Date:   01/22/2022    Order Specific Question:   Reason for Exam (SYMPTOM  OR DIAGNOSIS REQUIRED)    Answer:   R scrotal swelling eval hydrocele vs hernia    Order Specific Question:   Preferred imaging location?    Answer:   [facility]    Patient instructions: Hydrocele vs hernia - likely hydrocele. We will check ultrasound to determine. Then will refer you to urology vs general surgery for further evaluation.  Follow up plan: Return if symptoms worsen or fail to improve.  Eustaquio Boyden, MD

## 2021-01-22 NOTE — Addendum Note (Signed)
Addended by: Consuella Lose on: 01/22/2021 03:23 PM   Modules accepted: Orders

## 2021-01-22 NOTE — Patient Instructions (Signed)
Hydrocele vs hernia - likely hydrocele. We will check ultrasound to determine. Then will refer you to urology vs general surgery for further evaluation.  Hydrocele, Adult A hydrocele is a collection of fluid in the loose pouch of skin that holds the testicles (scrotum). This may happen because:  The amount of fluid produced in the scrotum is not absorbed by the rest of the body.  Fluid from the abdomen fills the scrotum. Normally, the testicles develop in the abdomen then drop into to the scrotum before birth. The tube that the testicles travel through usually closes after the testicles drop. If the tube does not close, fluid from the abdomen can fill the scrotum. This is not very common in adults. What are the causes? A hydrocele may be caused by:  An injury to the scrotum.  An infection.  Decreased blood flow to the scrotum.  Twisting of a testicle (testicular torsion).  A birth defect.  A tumor or cancer of the testicle. Sometimes, the cause is not known. What are the signs or symptoms? A hydrocele feels like a water-filled balloon. It may also feel heavy. Other symptoms include:  Swelling of the scrotum. The swelling may decrease when you lie down. You may also notice more swelling at night than in the morning. This is called a communicating hydrocele, in which the fluid in the scrotum goes back into the abdominal cavity when the position of the scrotum changes.  Swelling of the groin.  Mild discomfort in the scrotum.  Pain. This can develop if the hydrocele was caused by infection or twisting. The larger the hydrocele, the more likely you are to have pain. Swelling may also cause pain. How is this diagnosed? This condition may be diagnosed based on a physical exam and your medical history. You may also have tests, including:  Imaging tests, such as ultrasound.  A transillumination test. This test takes place in a dark room; a light is placed on the skin of the scrotum.  Clear liquid will not impede the light and the scrotum will be illuminated. This helps a health care provider distinguish a hydrocele from a tumor.  Blood or urine tests. How is this treated? Most hydroceles go away on their own. If you have no discomfort or pain, your health care provider may suggest close monitoring of your condition (called watch and wait or watchful waiting) until the condition goes away or symptoms develop. If treatment is needed, it may include:  Treating an underlying condition. This may include taking an antibiotic medicine to treat an infection.  Having surgery to stop fluid from collecting in the scrotum.  Having surgery to drain the fluid. Surgery may include: ? Hydrocelectomy. For this procedure, an incision is made in the scrotum to remove the fluid sac. ? Needle aspiration. A needle is used to drain fluid. However, the fluid buildup will come back quickly and may lead to an infection of the scrotum. This treatment is rarely used. Follow these instructions at home: Medicines  Take over-the-counter and prescription medicines only as told by your health care provider.  If you were prescribed an antibiotic medicine, take it as told by your health care provider. Do not stop taking the antibiotic even if you start to feel better. General instructions  Watch the hydrocele for any changes.  Keep all follow-up visits as told by your health care provider. This is important. Contact a health care provider if:  You notice any changes in the hydrocele.  The swelling  in your scrotum or groin gets worse.  The hydrocele becomes red, firm, painful, or tender to the touch.  You have a fever. Get help right away if you:  Develop a lot of pain or your pain becomes worse.  Have shaking chills.  Have a high fever. Summary  A hydrocele is a collection of fluid in the loose pouch of skin that holds the testicles (scrotum).  A hydrocele can cause swelling,  discomfort, and pain.  In adults, the cause of a hydrocele may not be known. However, it is sometimes caused by an infection or a rotation and twisting of the scrotum.  Treatment may not be needed. Hydroceles often go away on their own. If a hydrocele causes pain, treatment may be given to ease the pain. This information is not intended to replace advice given to you by your health care provider. Make sure you discuss any questions you have with your health care provider. Document Revised: 07/05/2019 Document Reviewed: 07/05/2019 Elsevier Patient Education  2021 [employer].

## 2021-01-29 ENCOUNTER — Ambulatory Visit
Admission: RE | Admit: 2021-01-29 | Discharge: 2021-01-29 | Disposition: A | Discharge status: Home / Self Care | Payer: BC Managed Care – PPO | Source: Ambulatory Visit | Attending: Family Medicine | Admitting: Family Medicine

## 2021-01-29 DIAGNOSIS — R1909 Other intra-abdominal and pelvic swelling, mass and lump: Secondary | ICD-10-CM | POA: Diagnosis not present

## 2021-01-29 DIAGNOSIS — N503 Cyst of epididymis: Secondary | ICD-10-CM | POA: Diagnosis not present

## 2021-01-29 DIAGNOSIS — N5089 Other specified disorders of the male genital organs: Secondary | ICD-10-CM

## 2021-01-29 DIAGNOSIS — N433 Hydrocele, unspecified: Secondary | ICD-10-CM | POA: Diagnosis not present

## 2021-01-29 DIAGNOSIS — N492 Inflammatory disorders of scrotum: Secondary | ICD-10-CM | POA: Diagnosis not present

## 2021-01-31 ENCOUNTER — Other Ambulatory Visit: Payer: Self-pay | Admitting: Family Medicine

## 2021-01-31 DIAGNOSIS — K4090 Unilateral inguinal hernia, without obstruction or gangrene, not specified as recurrent: Secondary | ICD-10-CM

## 2021-02-04 ENCOUNTER — Telehealth: Payer: Self-pay

## 2021-02-04 NOTE — Telephone Encounter (Signed)
Spoke with pt relaying results and Dr. Timoteo Expose message.  Pt verbalizes understanding and expresses his thanks to Dr. Reece Agar.

## 2021-02-04 NOTE — Telephone Encounter (Signed)
Lvm asking pt to call back.  Need to relay results and Dr. Timoteo Expose message.    [place] results/Dr. Timoteo Expose msg: Plz notify scrotal US returned reassuringly normal. It did show large right inguinal hernia - I have placed referral to general surgery for this.

## 2021-02-04 NOTE — Telephone Encounter (Signed)
Pt returned your call about results °

## 2021-03-11 DIAGNOSIS — K4090 Unilateral inguinal hernia, without obstruction or gangrene, not specified as recurrent: Secondary | ICD-10-CM | POA: Diagnosis not present

## 2021-05-09 DIAGNOSIS — G8918 Other acute postprocedural pain: Secondary | ICD-10-CM | POA: Diagnosis not present

## 2021-05-09 DIAGNOSIS — K4090 Unilateral inguinal hernia, without obstruction or gangrene, not specified as recurrent: Secondary | ICD-10-CM | POA: Diagnosis not present

## 2021-07-26 ENCOUNTER — Other Ambulatory Visit: Payer: Self-pay | Admitting: Family Medicine

## 2021-07-26 DIAGNOSIS — E785 Hyperlipidemia, unspecified: Secondary | ICD-10-CM

## 2021-07-26 DIAGNOSIS — Z125 Encounter for screening for malignant neoplasm of prostate: Secondary | ICD-10-CM

## 2021-07-28 ENCOUNTER — Other Ambulatory Visit: Payer: BC Managed Care – PPO

## 2021-08-04 ENCOUNTER — Ambulatory Visit (INDEPENDENT_AMBULATORY_CARE_PROVIDER_SITE_OTHER): Payer: BC Managed Care – PPO | Admitting: Family Medicine

## 2021-08-04 ENCOUNTER — Other Ambulatory Visit: Payer: Self-pay

## 2021-08-04 ENCOUNTER — Encounter: Payer: Self-pay | Admitting: Family Medicine

## 2021-08-04 VITALS — BP 136/86 | HR 62 | Temp 97.4°F | Ht 70.5 in | Wt 203.4 lb

## 2021-08-04 DIAGNOSIS — Z1211 Encounter for screening for malignant neoplasm of colon: Secondary | ICD-10-CM

## 2021-08-04 DIAGNOSIS — Z0000 Encounter for general adult medical examination without abnormal findings: Secondary | ICD-10-CM | POA: Diagnosis not present

## 2021-08-04 DIAGNOSIS — I1A0 Resistant hypertension: Secondary | ICD-10-CM

## 2021-08-04 DIAGNOSIS — I10 Essential (primary) hypertension: Secondary | ICD-10-CM

## 2021-08-04 DIAGNOSIS — Z96611 Presence of right artificial shoulder joint: Secondary | ICD-10-CM

## 2021-08-04 DIAGNOSIS — Z125 Encounter for screening for malignant neoplasm of prostate: Secondary | ICD-10-CM

## 2021-08-04 DIAGNOSIS — E785 Hyperlipidemia, unspecified: Secondary | ICD-10-CM

## 2021-08-04 DIAGNOSIS — Z96643 Presence of artificial hip joint, bilateral: Secondary | ICD-10-CM

## 2021-08-04 DIAGNOSIS — M160 Bilateral primary osteoarthritis of hip: Secondary | ICD-10-CM

## 2021-08-04 LAB — LIPID PANEL
Cholesterol: 239 mg/dL — ABNORMAL HIGH (ref 0–200)
HDL: 35.3 mg/dL — ABNORMAL LOW (ref >39.00)
NonHDL: 203.61
Total CHOL/HDL Ratio: 7
Triglycerides: 382 mg/dL — ABNORMAL HIGH (ref 0.0–149.0)
VLDL: 76.4 mg/dL — ABNORMAL HIGH (ref 0.0–40.0)

## 2021-08-04 LAB — COMPREHENSIVE METABOLIC PANEL
ALT: 20 U/L (ref 0–53)
AST: 16 U/L (ref 0–37)
Albumin: 4.4 g/dL (ref 3.5–5.2)
Alkaline Phosphatase: 85 U/L (ref 39–117)
BUN: 20 mg/dL (ref 6–23)
CO2: 32 mEq/L (ref 19–32)
Calcium: 9.6 mg/dL (ref 8.4–10.5)
Chloride: 100 mEq/L (ref 96–112)
Creatinine, Ser: 1.17 mg/dL (ref 0.40–1.50)
GFR: 66.27 mL/min (ref >60.00)
Glucose, Bld: 101 mg/dL — ABNORMAL HIGH (ref 70–99)
Potassium: 3.6 mEq/L (ref 3.5–5.1)
Sodium: 140 mEq/L (ref 135–145)
Total Bilirubin: 0.6 mg/dL (ref 0.2–1.2)
Total Protein: 7.2 g/dL (ref 6.0–8.3)

## 2021-08-04 LAB — LDL CHOLESTEROL, DIRECT: Direct LDL: 129 mg/dL

## 2021-08-04 LAB — PSA: PSA: 1.5 ng/mL (ref 0.10–4.00)

## 2021-08-04 MED ORDER — METOPROLOL SUCCINATE ER 50 MG PO TB24: 50.0000 mg | ORAL_TABLET | Freq: Every day | ORAL | 3 refills | Status: DC

## 2021-08-04 MED ORDER — ENALAPRIL MALEATE 20 MG PO TABS: 20.0000 mg | ORAL_TABLET | Freq: Two times a day (BID) | ORAL | 3 refills | Status: DC

## 2021-08-04 MED ORDER — TRIAMTERENE-HCTZ 37.5-25 MG PO TABS: 1.0000 | ORAL_TABLET | Freq: Every day | ORAL | 3 refills | Status: DC

## 2021-08-04 MED ORDER — SILDENAFIL CITRATE 100 MG PO TABS: 50.0000 mg | ORAL_TABLET | Freq: Every day | ORAL | 3 refills | Status: DC | PRN

## 2021-08-04 MED ORDER — AMLODIPINE BESYLATE 10 MG PO TABS: 10.0000 mg | ORAL_TABLET | Freq: Every day | ORAL | 3 refills | Status: DC

## 2021-08-04 NOTE — Patient Instructions (Addendum)
Labs today  Pass by lab to pick up stool kit.  Consider shingrix vaccine.  You are doing well today - blood pressure and weight is better.  Return as needed or in 1 year for next physical.  May try viagra 50-148m sent to pharmacy.   Health Maintenance, Male Adopting a healthy lifestyle and getting preventive care are important in promoting health and wellness. Ask your health care provider about: The right schedule for you to have regular tests and exams. Things you can do on your own to prevent diseases and keep yourself healthy. What should I know about diet, weight, and exercise? Eat a healthy diet  Eat a diet that includes plenty of vegetables, fruits, low-fat dairy products, and lean protein. Do not eat a lot of foods that are high in solid fats, added sugars, or sodium. Maintain a healthy weight Body mass index (BMI) is a measurement that can be used to identify possible weight problems. It estimates body fat based on height and weight. Your health care provider can help determine your BMI and help you achieve or maintain a healthy weight. Get regular exercise Get regular exercise. This is one of the most important things you can do for your health. Most adults should: Exercise for at least 150 minutes each week. The exercise should increase your heart rate and make you sweat (moderate-intensity exercise). Do strengthening exercises at least twice a week. This is in addition to the moderate-intensity exercise. Spend less time sitting. Even light physical activity can be beneficial. Watch cholesterol and blood lipids Have your blood tested for lipids and cholesterol at 63years of age, then have this test every 5 years. You may need to have your cholesterol levels checked more often if: Your lipid or cholesterol levels are high. You are older than 63years of age. You are at high risk for heart disease. What should I know about cancer screening? Many types of cancers can be  detected early and may often be prevented. Depending on your health history and family history, you may need to have cancer screening at various ages. This may include screening for: Colorectal cancer. Prostate cancer. Skin cancer. Lung cancer. What should I know about heart disease, diabetes, and high blood pressure? Blood pressure and heart disease High blood pressure causes heart disease and increases the risk of stroke. This is more likely to develop in people who have high blood pressure readings or are overweight. Talk with your health care provider about your target blood pressure readings. Have your blood pressure checked: Every 3-5 years if you are [id number] of age. Every year if you are 427years old or older. If you are between the ages of 655and 770and are a current or former smoker, ask your health care provider if you should have a one-time screening for abdominal aortic aneurysm (AAA). Diabetes Have regular diabetes screenings. This checks your fasting blood sugar level. Have the screening done: Once every three years after age 4857if you are at a normal weight and have a low risk for diabetes. More often and at a younger age if you are overweight or have a high risk for diabetes. What should I know about preventing infection? Hepatitis B If you have a higher risk for hepatitis B, you should be screened for this virus. Talk with your health care provider to find out if you are at risk for hepatitis B infection. Hepatitis C Blood testing is recommended for: Everyone born from [birth year]  through 1963. Anyone with known risk factors for hepatitis C. Sexually transmitted infections (STIs) You should be screened each year for STIs, including gonorrhea and chlamydia, if: You are sexually active and are younger than 63 years of age. You are older than 63 years of age and your health care provider tells you that you are at risk for this type of infection. Your sexual activity has changed  since you were last screened, and you are at increased risk for chlamydia or gonorrhea. Ask your health care provider if you are at risk. Ask your health care provider about whether you are at high risk for HIV. Your health care provider may recommend a prescription medicine to help prevent HIV infection. If you choose to take medicine to prevent HIV, you should first get tested for HIV. You should then be tested every 3 months for as long as you are taking the medicine. Follow these instructions at home: Alcohol use Do not drink alcohol if your health care provider tells you not to drink. If you drink alcohol: Limit how much you have to 0-2 drinks a day. Know how much alcohol is in your drink. In the [place] one drink equals one 12 oz bottle of beer (355 mL), one 5 oz glass of wine (148 mL), or one 1 oz glass of hard liquor (44 mL). Lifestyle Do not use any products that contain nicotine or tobacco. These products include cigarettes, chewing tobacco, and vaping devices, such as e-cigarettes. If you need help quitting, ask your health care provider. Do not use street drugs. Do not share needles. Ask your health care provider for help if you need support or information about quitting drugs. General instructions Schedule regular health, dental, and eye exams. Stay current with your vaccines. Tell your health care provider if: You often feel depressed. You have ever been abused or do not feel safe at home. Summary Adopting a healthy lifestyle and getting preventive care are important in promoting health and wellness. Follow your health care provider's instructions about healthy diet, exercising, and getting tested or screened for diseases. Follow your health care provider's instructions on monitoring your cholesterol and blood pressure. This information is not intended to replace advice given to you by your health care provider. Make sure you discuss any questions you have with your health care  provider. Document Revised: 01/27/2021 Document Reviewed: 01/27/2021 Elsevier Patient Education  [place].

## 2021-08-04 NOTE — Assessment & Plan Note (Addendum)
Chronic, off statin. Update FLP and consider statin.  The 10-year ASCVD risk score (Arnett DK, et al., 2019) is: 17.6%   Values used to calculate the score:     Age: 65 years     Sex: Male     Is Non-Hispanic African American: No     Diabetic: No     Tobacco smoker: No     Systolic Blood Pressure: 136 mmHg     Is BP treated: Yes     HDL Cholesterol: 30.5 mg/dL     Total Cholesterol: 181 mg/dL

## 2021-08-04 NOTE — Assessment & Plan Note (Signed)
Chronic, stable on current 4 drug regimen - continue this. BP better controlled after joint replacement surgeries (anticipate better pain control after this)

## 2021-08-04 NOTE — Assessment & Plan Note (Signed)
Preventative protocols reviewed and updated unless pt declined. Discussed healthy diet and lifestyle.  

## 2021-08-04 NOTE — Progress Notes (Signed)
Patient ID: Roy Galvan, male    DOB: 1955/10/24, 65 y.o.   MRN: [id number]  This visit was conducted in person.  BP 136/86   Pulse 62   Temp (!) 97.4 F (36.3 C) (Temporal)   Ht 5' 10.5" (1.791 m)   Wt 203 lb 7 oz (92.3 kg)   SpO2 97%   BMI 28.78 kg/m    CC: CPE Subjective:   HPI: Roy Galvan is a 65 y.o. male presenting on 08/04/2021 for Annual Exam   Since last seen, R shoulder replacement, bilateral hip replacement and R hernia repair.  30 lb weight loss over the past 6 months. Diet changes, more active after above. Goal weight is 190 lbs.   Notes ED - trouble obtaining erection.   Preventative: COLONOSCOPY Date: 2011 WNL, rec rpt 10 yrs (Dr. Candace Cruise). Discussed options, would like iFOB. No fmhx colon cancer.  Prostate cancer screening - father with h/o prostate cancer. Desires yearly screen. No prostate symptoms.  Lung cancer screening - not eligible  Flu shot yearly - declines  COVID vaccine - discussed, declines. Had COVID-19 illness 08/2019 (close exposure), didn't get tested.  Tetanus 2011, Tdap 04/2019  Pneumovax - not due  Shingrix - discussed - will consider  Seat belt use discussed  Sunscreen use discussed. No changing moles on skin.  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 Interim medical history since our last visit reviewed. Allergies and medications reviewed and updated. Outpatient Medications Prior to Visit  Medication Sig Dispense Refill   acetaminophen (TYLENOL) 500 MG tablet Take 1 tablet (500 mg total) by mouth in the morning and at bedtime.     aspirin 81 MG  tablet Take 81 mg by mouth daily.     amLODipine (NORVASC) 10 MG tablet Take 1 tablet (10 mg total) by mouth daily. 90 tablet 3   enalapril (VASOTEC) 20 MG tablet Take 1 tablet (20 mg total) by mouth 2 (two) times daily. 180 tablet 3   metoprolol succinate (TOPROL-XL) 50 MG 24 hr tablet Take 1 tablet (50 mg total) by mouth daily. Take with or immediately following a meal. 90 tablet 3   Potassium 99 MG TABS Take 1 tablet (99 mg total) by mouth daily.     triamterene-hydrochlorothiazide (MAXZIDE-25) 37.5-25 MG tablet Take 1 tablet by mouth daily. 90 tablet 3   potassium chloride (KLOR-CON) 10 MEQ tablet Take 1 tablet (10 mEq total) by mouth daily for 3 days. 3 tablet 0   No facility-administered medications prior to visit.     Per HPI unless specifically indicated in ROS section below Review of Systems  Objective:  BP 136/86   Pulse 62   Temp (!) 97.4 F (36.3 C) (Temporal)   Ht 5' 10.5" (1.791 m)   Wt 203 lb 7 oz (92.3 kg)   SpO2 97%   BMI 28.78 kg/m   Wt Readings from Last 3 Encounters:  08/04/21 203 lb 7 oz (92.3 kg)  01/22/21 233 lb 4 oz (105.8 kg)  09/16/20 215 lb 3  oz (97.6 kg)      Physical Exam    Results for orders placed or performed in visit on 11/21/20  Protime-INR  Result Value Ref Range   INR 1.0 0.8 - 1.0 ratio   Prothrombin Time 11.1 9.6 - 13.1 sec  CBC with Differential/Platelet  Result Value Ref Range   WBC 9.6 4.0 - 10.5 K/uL   RBC 4.66 4.22 - 5.81 Mil/uL   Hemoglobin 13.6 13.0 - 17.0 g/dL   HCT 40.7 39.0 - 52.0 %   MCV 87.5 78.0 - 100.0 fl   MCHC 33.4 30.0 - 36.0 g/dL   RDW 13.6 11.5 - 15.5 %   Platelets 347.0 150.0 - 400.0 K/uL   Neutrophils Relative % 58.5 43.0 - 77.0 %   Lymphocytes Relative 31.1 12.0 - 46.0 %   Monocytes Relative 6.6 3.0 - 12.0 %   Eosinophils Relative 2.8 0.0 - 5.0 %   Basophils Relative 1.0 0.0 - 3.0 %   Neutro Abs 5.6 1.4 - 7.7 K/uL   Lymphs Abs 3.0 0.7 - 4.0 K/uL   Monocytes Absolute 0.6 0.1 - 1.0 K/uL   Eosinophils  Absolute 0.3 0.0 - 0.7 K/uL   Basophils Absolute 0.1 0.0 - 0.1 K/uL  Basic metabolic panel  Result Value Ref Range   Sodium 141 135 - 145 mEq/L   Potassium 3.4 (L) 3.5 - 5.1 mEq/L   Chloride 104 96 - 112 mEq/L   CO2 29 19 - 32 mEq/L   Glucose, Bld 103 (H) 70 - 99 mg/dL   BUN 16 6 - 23 mg/dL   Creatinine, Ser 1.00 0.40 - 1.50 mg/dL   GFR 80.41 >60.00 mL/min   Calcium 9.1 8.4 - 10.5 mg/dL    Assessment & Plan:  This visit occurred during the SARS-CoV-2 public health emergency.  Safety protocols were in place, including screening questions prior to the visit, additional usage of staff PPE, and extensive cleaning of exam room while observing appropriate contact time as indicated for disinfecting solutions.   Problem List Items Addressed This Visit     Healthcare maintenance - Primary (Chronic)    Preventative protocols reviewed and updated unless pt declined. Discussed healthy diet and lifestyle.       Resistant hypertension    Chronic, stable on current 4 drug regimen - continue this. BP better controlled after joint replacement surgeries (anticipate better pain control after this)       Relevant Medications   amLODipine (NORVASC) 10 MG tablet   enalapril (VASOTEC) 20 MG tablet   metoprolol succinate (TOPROL-XL) 50 MG 24 hr tablet   triamterene-hydrochlorothiazide (MAXZIDE-25) 37.5-25 MG tablet   sildenafil (VIAGRA) 100 MG tablet   Bilateral primary osteoarthritis of hip    S/p bilateral hip replacements 2022      Dyslipidemia    Chronic, off statin. Update FLP and consider statin.  The 10-year ASCVD risk score (Arnett DK, et al., 2019) is: 17.6%   Values used to calculate the score:     Age: 38 years     Sex: Male     Is Non-Hispanic African American: No     Diabetic: No     Tobacco smoker: No     Systolic Blood Pressure: 864 mmHg     Is BP treated: Yes     HDL Cholesterol: 30.5 mg/dL     Total Cholesterol: 181 mg/dL       Status post total shoulder arthroplasty,  right   S/P hip replacement, bilateral  Other Visit Diagnoses     Special screening for malignant neoplasm of prostate       Special screening for malignant neoplasms, colon       Relevant Orders   Fecal occult blood, imunochemical        Meds ordered this encounter  Medications   amLODipine (NORVASC) 10 MG tablet    Sig: Take 1 tablet (10 mg total) by mouth daily.    Dispense:  90 tablet    Refill:  3   enalapril (VASOTEC) 20 MG tablet    Sig: Take 1 tablet (20 mg total) by mouth 2 (two) times daily.    Dispense:  180 tablet    Refill:  3   metoprolol succinate (TOPROL-XL) 50 MG 24 hr tablet    Sig: Take 1 tablet (50 mg total) by mouth daily. Take with or immediately following a meal.    Dispense:  90 tablet    Refill:  3   triamterene-hydrochlorothiazide (MAXZIDE-25) 37.5-25 MG tablet    Sig: Take 1 tablet by mouth daily.    Dispense:  90 tablet    Refill:  3   sildenafil (VIAGRA) 100 MG tablet    Sig: Take 0.5-1 tablets (50-100 mg total) by mouth daily as needed for erectile dysfunction.    Dispense:  5 tablet    Refill:  3   Orders Placed This Encounter  Procedures   Fecal occult blood, imunochemical    Standing Status:   Future    Standing Expiration Date:   08/04/2022     Patient instructions: Labs today  Pass by lab to pick up stool kit.  Consider shingrix vaccine.  You are doing well today - blood pressure and weight is better.  Return as needed or in 1 year for next physical.  May try viagra 50-133m sent to pharmacy.   Follow up plan: Return in about 1 year (around 08/04/2022) for annual exam, prior fasting for blood work.  JRia Bush MD

## 2021-08-04 NOTE — Assessment & Plan Note (Signed)
S/p bilateral hip replacements 2022

## 2021-08-05 ENCOUNTER — Other Ambulatory Visit: Payer: Self-pay | Admitting: Family Medicine

## 2021-08-05 MED ORDER — ATORVASTATIN CALCIUM 20 MG PO TABS: 20.0000 mg | ORAL_TABLET | Freq: Every day | ORAL | 11 refills | Status: DC

## 2022-04-06 IMAGING — DX DG SHOULDER 2+V PORT*R*
1 series · 1 of 1 positions shown · non-contrast
Comparison: 02/05/2020

CLINICAL DATA: Right shoulder arthroplasty

EXAM:
PORTABLE RIGHT SHOULDER

[shoulder ap]
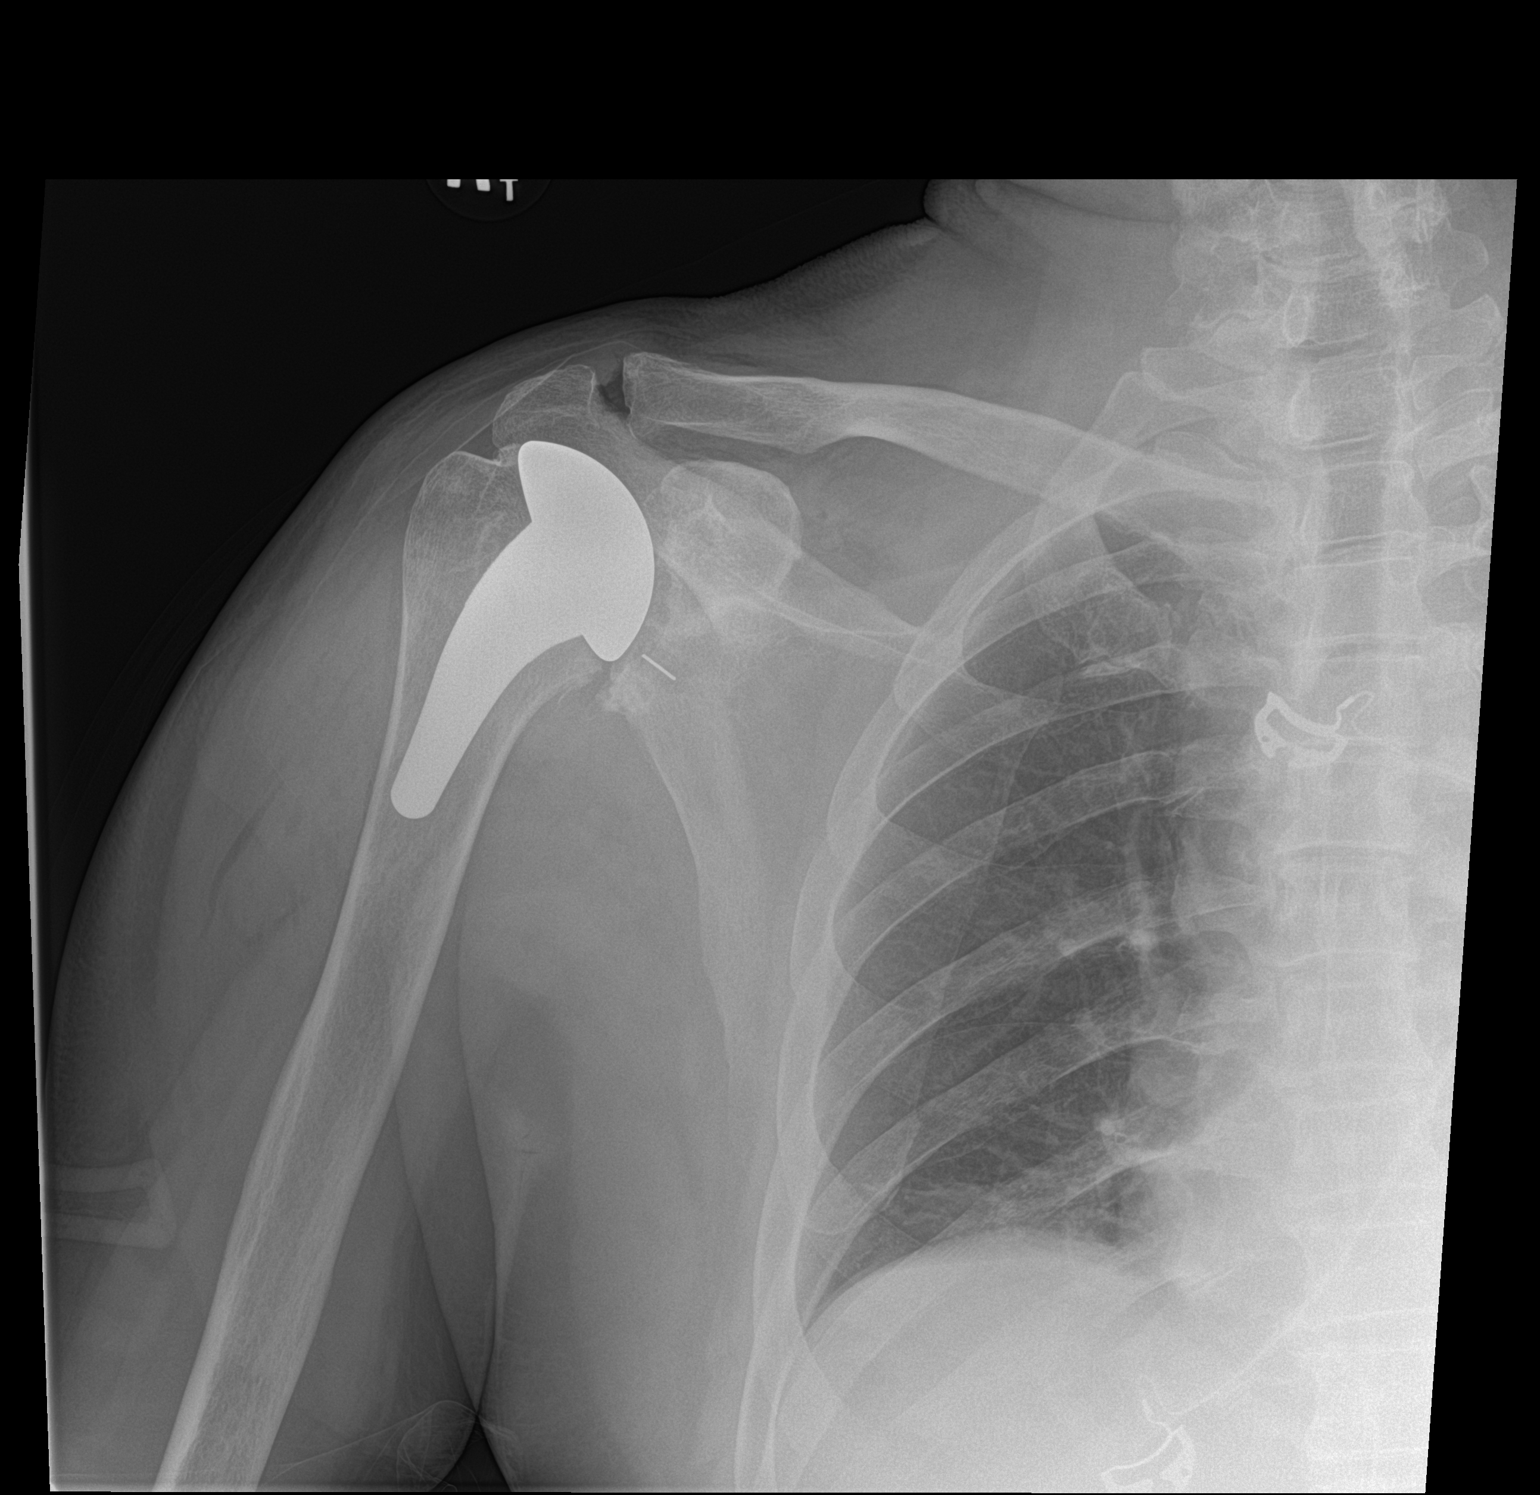

[1 of 1 positions shown; findings below may reference images not displayed]

FINDINGS: Interval postsurgical changes from right total shoulder
arthroplasty. Arthroplasty components appear in their expected
alignment. No evidence of periprosthetic fracture or other
complication. Expected postoperative changes within the overlying
soft tissues.
IMPRESSION: Interval right total shoulder arthroplasty without evidence of
immediate postoperative complication.

## 2022-07-26 ENCOUNTER — Other Ambulatory Visit: Payer: Self-pay | Admitting: Family Medicine

## 2022-07-26 DIAGNOSIS — Z125 Encounter for screening for malignant neoplasm of prostate: Secondary | ICD-10-CM

## 2022-07-26 DIAGNOSIS — E785 Hyperlipidemia, unspecified: Secondary | ICD-10-CM

## 2022-07-29 ENCOUNTER — Other Ambulatory Visit (INDEPENDENT_AMBULATORY_CARE_PROVIDER_SITE_OTHER): Payer: BC Managed Care – PPO

## 2022-07-29 DIAGNOSIS — E785 Hyperlipidemia, unspecified: Secondary | ICD-10-CM

## 2022-07-29 DIAGNOSIS — Z125 Encounter for screening for malignant neoplasm of prostate: Secondary | ICD-10-CM

## 2022-07-29 LAB — LIPID PANEL
Cholesterol: 220 mg/dL — ABNORMAL HIGH (ref 0–200)
HDL: 37.4 mg/dL — ABNORMAL LOW (ref >39.00)
NonHDL: 182.3
Total CHOL/HDL Ratio: 6
Triglycerides: 248 mg/dL — ABNORMAL HIGH (ref 0.0–149.0)
VLDL: 49.6 mg/dL — ABNORMAL HIGH (ref 0.0–40.0)

## 2022-07-29 LAB — COMPREHENSIVE METABOLIC PANEL
ALT: 14 U/L (ref 0–53)
AST: 14 U/L (ref 0–37)
Albumin: 4.4 g/dL (ref 3.5–5.2)
Alkaline Phosphatase: 76 U/L (ref 39–117)
BUN: 18 mg/dL (ref 6–23)
CO2: 33 mEq/L — ABNORMAL HIGH (ref 19–32)
Calcium: 9.4 mg/dL (ref 8.4–10.5)
Chloride: 102 mEq/L (ref 96–112)
Creatinine, Ser: 1.13 mg/dL (ref 0.40–1.50)
GFR: 68.62 mL/min (ref >60.00)
Glucose, Bld: 100 mg/dL — ABNORMAL HIGH (ref 70–99)
Potassium: 3.8 mEq/L (ref 3.5–5.1)
Sodium: 142 mEq/L (ref 135–145)
Total Bilirubin: 0.6 mg/dL (ref 0.2–1.2)
Total Protein: 7.1 g/dL (ref 6.0–8.3)

## 2022-07-29 LAB — LDL CHOLESTEROL, DIRECT: Direct LDL: 132 mg/dL

## 2022-07-29 LAB — PSA: PSA: 1.47 ng/mL (ref 0.10–4.00)

## 2022-08-04 ENCOUNTER — Encounter: Payer: Self-pay | Admitting: Family Medicine

## 2022-08-04 ENCOUNTER — Ambulatory Visit (INDEPENDENT_AMBULATORY_CARE_PROVIDER_SITE_OTHER): Payer: BC Managed Care – PPO | Admitting: Family Medicine

## 2022-08-04 VITALS — BP 138/82 | HR 62 | Temp 97.9°F | Ht 69.75 in | Wt 201.0 lb

## 2022-08-04 DIAGNOSIS — I1A0 Resistant hypertension: Secondary | ICD-10-CM | POA: Diagnosis not present

## 2022-08-04 DIAGNOSIS — E785 Hyperlipidemia, unspecified: Secondary | ICD-10-CM | POA: Diagnosis not present

## 2022-08-04 DIAGNOSIS — Z0000 Encounter for general adult medical examination without abnormal findings: Secondary | ICD-10-CM | POA: Diagnosis not present

## 2022-08-04 DIAGNOSIS — Z1211 Encounter for screening for malignant neoplasm of colon: Secondary | ICD-10-CM

## 2022-08-04 MED ORDER — ATORVASTATIN CALCIUM 20 MG PO TABS: 20.0000 mg | ORAL_TABLET | Freq: Every day | ORAL | 3 refills | Status: DC

## 2022-08-04 MED ORDER — ENALAPRIL MALEATE 20 MG PO TABS: 20.0000 mg | ORAL_TABLET | Freq: Two times a day (BID) | ORAL | 3 refills | Status: DC

## 2022-08-04 MED ORDER — SILDENAFIL CITRATE 100 MG PO TABS: 50.0000 mg | ORAL_TABLET | Freq: Every day | ORAL | 11 refills | Status: DC | PRN

## 2022-08-04 MED ORDER — METOPROLOL SUCCINATE ER 50 MG PO TB24: 50.0000 mg | ORAL_TABLET | Freq: Every day | ORAL | 3 refills | Status: DC

## 2022-08-04 MED ORDER — AMLODIPINE BESYLATE 10 MG PO TABS: 10.0000 mg | ORAL_TABLET | Freq: Every day | ORAL | 3 refills | Status: DC

## 2022-08-04 MED ORDER — TRIAMTERENE-HCTZ 37.5-25 MG PO TABS: 1.0000 | ORAL_TABLET | Freq: Every day | ORAL | 3 refills | Status: DC

## 2022-08-04 NOTE — Assessment & Plan Note (Addendum)
He stopped statin due to concern over possible side effects. Reviewed indications for statin, as well as option of checking Lp(a) vs coronary calcium score. He will retrial statin.  The 10-year ASCVD risk score (Arnett DK, et al., 2019) is: 19.6%   Values used to calculate the score:     Age: 66 years     Sex: Male     Is Non-Hispanic African American: No     Diabetic: No     Tobacco smoker: No     Systolic Blood Pressure: 138 mmHg     Is BP treated: Yes     HDL Cholesterol: 37.4 mg/dL     Total Cholesterol: 220 mg/dL

## 2022-08-04 NOTE — Patient Instructions (Addendum)
Pass by lab to pick up stool kit.  Retry atorvastatin 41m daily.  Good to see you today Return as needed or in 1 year for next physical.   Health Maintenance, Male Adopting a healthy lifestyle and getting preventive care are important in promoting health and wellness. Ask your health care provider about: The right schedule for you to have regular tests and exams. Things you can do on your own to prevent diseases and keep yourself healthy. What should I know about diet, weight, and exercise? Eat a healthy diet  Eat a diet that includes plenty of vegetables, fruits, low-fat dairy products, and lean protein. Do not eat a lot of foods that are high in solid fats, added sugars, or sodium. Maintain a healthy weight Body mass index (BMI) is a measurement that can be used to identify possible weight problems. It estimates body fat based on height and weight. Your health care provider can help determine your BMI and help you achieve or maintain a healthy weight. Get regular exercise Get regular exercise. This is one of the most important things you can do for your health. Most adults should: Exercise for at least 150 minutes each week. The exercise should increase your heart rate and make you sweat (moderate-intensity exercise). Do strengthening exercises at least twice a week. This is in addition to the moderate-intensity exercise. Spend less time sitting. Even light physical activity can be beneficial. Watch cholesterol and blood lipids Have your blood tested for lipids and cholesterol at 64years of age, then have this test every 5 years. You may need to have your cholesterol levels checked more often if: Your lipid or cholesterol levels are high. You are older than 64years of age. You are at high risk for heart disease. What should I know about cancer screening? Many types of cancers can be detected early and may often be prevented. Depending on your health history and family history, you  may need to have cancer screening at various ages. This may include screening for: Colorectal cancer. Prostate cancer. Skin cancer. Lung cancer. What should I know about heart disease, diabetes, and high blood pressure? Blood pressure and heart disease High blood pressure causes heart disease and increases the risk of stroke. This is more likely to develop in people who have high blood pressure readings or are overweight. Talk with your health care provider about your target blood pressure readings. Have your blood pressure checked: Every 3-5 years if you are 64years of age. Every year if you are 647years old or older. If you are between the ages of 64and 64and are a current or former smoker, ask your health care provider if you should have a one-time screening for abdominal aortic aneurysm (AAA). Diabetes Have regular diabetes screenings. This checks your fasting blood sugar level. Have the screening done: Once every three years after age 6444if you are at a normal weight and have a low risk for diabetes. More often and at a younger age if you are overweight or have a high risk for diabetes. What should I know about preventing infection? Hepatitis B If you have a higher risk for hepatitis B, you should be screened for this virus. Talk with your health care provider to find out if you are at risk for hepatitis B infection. Hepatitis C Blood testing is recommended for: Everyone born from [birth year]through 1963. Anyone with known risk factors for hepatitis C. Sexually transmitted infections (STIs) You should be screened  each year for STIs, including gonorrhea and chlamydia, if: You are sexually active and are younger than 64 years of age. You are older than 64 years of age and your health care provider tells you that you are at risk for this type of infection. Your sexual activity has changed since you were last screened, and you are at increased risk for chlamydia or gonorrhea. Ask your  health care provider if you are at risk. Ask your health care provider about whether you are at high risk for HIV. Your health care provider may recommend a prescription medicine to help prevent HIV infection. If you choose to take medicine to prevent HIV, you should first get tested for HIV. You should then be tested every 3 months for as long as you are taking the medicine. Follow these instructions at home: Alcohol use Do not drink alcohol if your health care provider tells you not to drink. If you drink alcohol: Limit how much you have to 0-2 drinks a day. Know how much alcohol is in your drink. In the [place] one drink equals one 12 oz bottle of beer (355 mL), one 5 oz glass of wine (148 mL), or one 1 oz glass of hard liquor (44 mL). Lifestyle Do not use any products that contain nicotine or tobacco. These products include cigarettes, chewing tobacco, and vaping devices, such as e-cigarettes. If you need help quitting, ask your health care provider. Do not use street drugs. Do not share needles. Ask your health care provider for help if you need support or information about quitting drugs. General instructions Schedule regular health, dental, and eye exams. Stay current with your vaccines. Tell your health care provider if: You often feel depressed. You have ever been abused or do not feel safe at home. Summary Adopting a healthy lifestyle and getting preventive care are important in promoting health and wellness. Follow your health care provider's instructions about healthy diet, exercising, and getting tested or screened for diseases. Follow your health care provider's instructions on monitoring your cholesterol and blood pressure. This information is not intended to replace advice given to you by your health care provider. Make sure you discuss any questions you have with your health care provider. Document Revised: 01/27/2021 Document Reviewed: 01/27/2021 Elsevier Patient Education   [employer].

## 2022-08-04 NOTE — Progress Notes (Signed)
Patient ID: Roy Galvan, male    DOB: 11/15/1955, 66 y.o.   MRN: [id number]  This visit was conducted in person.  BP 138/82   Pulse 62   Temp 97.9 F (36.6 C) (Temporal)   Ht 5' 9.75" (1.772 m)   Wt 201 lb (91.2 kg)   SpO2 94%   BMI 29.05 kg/m    CC: CPE Subjective:   HPI: Roy Galvan is a 66 y.o. male presenting on 08/04/2022 for Annual Exam (C/o swollen glands in neck. )   He stopped cholesterol medicine 11/2021 - due to concern over possible side effects.   Preventative: COLONOSCOPY Date: 2011 WNL, rec rpt 10 yrs (Dr. Candace Cruise). Discussed options, would like iFOB. No fmhx colon cancer.  Prostate cancer screening - father with h/o prostate cancer treated with seeds. Continues yearly screen.  Lung cancer screening - not eligible  Flu shot yearly - declines  COVID vaccine - discussed, declines.   Tetanus 2011, Tdap 04/2019  Pneumovax - not due  Shingrix - discussed, declines  Advanced directive -  Seat belt use discussed  Sunscreen use discussed. No changing moles on skin.  Sleep - averaging 6-8 hrs/night Non smoker  Alcohol - 28 yrs without drinking  Dentist - yearly Eye exam - has not seen   Lives with wife, 2 sons and 1 daughter, dogs and cats (rescue)   Occupation: owns [employer] in [place]   Edu: HS   Activity: no regular exercise 2/2 hip pain  Diet: good water, fruits/vegetables daily     Relevant past medical, surgical, family and social history reviewed and updated as indicated. Interim medical history since our last visit reviewed. Allergies and medications reviewed and updated. Outpatient Medications Prior to Visit  Medication Sig Dispense Refill   acetaminophen (TYLENOL) 500 MG tablet Take 1 tablet (500 mg total) by mouth in the morning and at bedtime.     aspirin 81 MG tablet Take 81 mg by mouth daily.     amLODipine (NORVASC) 10 MG tablet Take 1 tablet (10 mg total) by mouth daily. 90 tablet 3   enalapril (VASOTEC) 20 MG tablet Take 1  tablet (20 mg total) by mouth 2 (two) times daily. 180 tablet 3   metoprolol succinate (TOPROL-XL) 50 MG 24 hr tablet Take 1 tablet (50 mg total) by mouth daily. Take with or immediately following a meal. 90 tablet 3   sildenafil (VIAGRA) 100 MG tablet Take 0.5-1 tablets (50-100 mg total) by mouth daily as needed for erectile dysfunction. 5 tablet 3   triamterene-hydrochlorothiazide (MAXZIDE-25) 37.5-25 MG tablet Take 1 tablet by mouth daily. 90 tablet 3   atorvastatin (LIPITOR) 20 MG tablet Take 1 tablet (20 mg total) by mouth daily. (Patient not taking: Reported on 08/04/2022) 30 tablet 11   No facility-administered medications prior to visit.     Per HPI unless specifically indicated in ROS section below Review of Systems  Constitutional:  Negative for activity change, appetite change, chills, fatigue, fever and unexpected weight change.  HENT:  Negative for hearing loss.   Eyes:  Negative for visual disturbance.  Respiratory:  Negative for cough, chest tightness, shortness of breath and wheezing.   Cardiovascular:  Negative for chest pain, palpitations and leg swelling.  Gastrointestinal:  Negative for abdominal distention, abdominal pain, blood in stool, constipation, diarrhea, nausea and vomiting.  Genitourinary:  Negative for difficulty urinating and hematuria.  Musculoskeletal:  Negative for arthralgias, myalgias and neck pain.  Skin:  Negative for rash.  Neurological:  Negative for dizziness, seizures, syncope and headaches.  Hematological:  Negative for adenopathy. Does not bruise/bleed easily.  Psychiatric/Behavioral:  Negative for dysphoric mood. The patient is not nervous/anxious.     Objective:  BP 138/82   Pulse 62   Temp 97.9 F (36.6 C) (Temporal)   Ht 5' 9.75" (1.772 m)   Wt 201 lb (91.2 kg)   SpO2 94%   BMI 29.05 kg/m   Wt Readings from Last 3 Encounters:  08/04/22 201 lb (91.2 kg)  08/04/21 203 lb 7 oz (92.3 kg)  01/22/21 233 lb 4 oz (105.8 kg)       Physical Exam Vitals and nursing note reviewed.  Constitutional:      General: He is not in acute distress.    Appearance: Normal appearance. He is well-developed. He is not ill-appearing.  HENT:     Head: Normocephalic and atraumatic.     Right Ear: Hearing, tympanic membrane, ear canal and external ear normal.     Left Ear: Hearing, tympanic membrane, ear canal and external ear normal.     Nose: Nose normal.     Mouth/Throat:     Mouth: Mucous membranes are moist.     Pharynx: Oropharynx is clear. No oropharyngeal exudate or posterior oropharyngeal erythema.  Eyes:     General: No scleral icterus.    Extraocular Movements: Extraocular movements intact.     Conjunctiva/sclera: Conjunctivae normal.     Pupils: Pupils are equal, round, and reactive to light.  Neck:     Thyroid: No thyroid mass or thyromegaly.  Cardiovascular:     Rate and Rhythm: Normal rate and regular rhythm.     Pulses: Normal pulses.          Radial pulses are 2+ on the right side and 2+ on the left side.     Heart sounds: Normal heart sounds. No murmur heard. Pulmonary:     Effort: Pulmonary effort is normal. No respiratory distress.     Breath sounds: Normal breath sounds. No wheezing, rhonchi or rales.  Abdominal:     General: Bowel sounds are normal. There is no distension.     Palpations: Abdomen is soft. There is no mass.     Tenderness: There is no abdominal tenderness. There is no guarding or rebound.     Hernia: No hernia is present.  Musculoskeletal:        General: Normal range of motion.     Cervical back: Normal range of motion and neck supple.     Right lower leg: No edema.     Left lower leg: No edema.  Lymphadenopathy:     Cervical: No cervical adenopathy.  Skin:    General: Skin is warm and dry.     Findings: No rash.  Neurological:     General: No focal deficit present.     Mental Status: He is alert and oriented to person, place, and time.  Psychiatric:        Mood and Affect:  Mood normal.        Behavior: Behavior normal.        Thought Content: Thought content normal.        Judgment: Judgment normal.       Results for orders placed or performed in visit on 07/29/22  PSA  Result Value Ref Range   PSA 1.47 0.10 - 4.00 ng/mL  Comprehensive metabolic panel  Result Value Ref Range   Sodium 142  135 - 145 mEq/L   Potassium 3.8 3.5 - 5.1 mEq/L   Chloride 102 96 - 112 mEq/L   CO2 33 (H) 19 - 32 mEq/L   Glucose, Bld 100 (H) 70 - 99 mg/dL   BUN 18 6 - 23 mg/dL   Creatinine, Ser 1.13 0.40 - 1.50 mg/dL   Total Bilirubin 0.6 0.2 - 1.2 mg/dL   Alkaline Phosphatase 76 39 - 117 U/L   AST 14 0 - 37 U/L   ALT 14 0 - 53 U/L   Total Protein 7.1 6.0 - 8.3 g/dL   Albumin 4.4 3.5 - 5.2 g/dL   GFR 68.62 >60.00 mL/min   Calcium 9.4 8.4 - 10.5 mg/dL  Lipid panel  Result Value Ref Range   Cholesterol 220 (H) 0 - 200 mg/dL   Triglycerides 248.0 (H) 0.0 - 149.0 mg/dL   HDL 37.40 (L) >39.00 mg/dL   VLDL 49.6 (H) 0.0 - 40.0 mg/dL   Total CHOL/HDL Ratio 6    NonHDL 182.30   LDL cholesterol, direct  Result Value Ref Range   Direct LDL 132.0 mg/dL    Assessment & Plan:   Problem List Items Addressed This Visit     Healthcare maintenance - Primary (Chronic)    Preventative protocols reviewed and updated unless pt declined. Discussed healthy diet and lifestyle.       Resistant hypertension    Chronic, stable on current regimen - continue.       Relevant Medications   amLODipine (NORVASC) 10 MG tablet   atorvastatin (LIPITOR) 20 MG tablet   enalapril (VASOTEC) 20 MG tablet   metoprolol succinate (TOPROL-XL) 50 MG 24 hr tablet   sildenafil (VIAGRA) 100 MG tablet   triamterene-hydrochlorothiazide (MAXZIDE-25) 37.5-25 MG tablet   Dyslipidemia    He stopped statin due to concern over possible side effects. Reviewed indications for statin, as well as option of checking Lp(a) vs coronary calcium score. He will retrial statin.  The 10-year ASCVD risk score (Arnett DK,  et al., 2019) is: 19.6%   Values used to calculate the score:     Age: 41 years     Sex: Male     Is Non-Hispanic African American: No     Diabetic: No     Tobacco smoker: No     Systolic Blood Pressure: 060 mmHg     Is BP treated: Yes     HDL Cholesterol: 37.4 mg/dL     Total Cholesterol: 220 mg/dL      Relevant Medications   atorvastatin (LIPITOR) 20 MG tablet   Other Visit Diagnoses     Special screening for malignant neoplasms, colon       Relevant Orders   Fecal occult blood, imunochemical        Meds ordered this encounter  Medications   amLODipine (NORVASC) 10 MG tablet    Sig: Take 1 tablet (10 mg total) by mouth daily.    Dispense:  90 tablet    Refill:  3   atorvastatin (LIPITOR) 20 MG tablet    Sig: Take 1 tablet (20 mg total) by mouth daily.    Dispense:  90 tablet    Refill:  3   enalapril (VASOTEC) 20 MG tablet    Sig: Take 1 tablet (20 mg total) by mouth 2 (two) times daily.    Dispense:  180 tablet    Refill:  3   metoprolol succinate (TOPROL-XL) 50 MG 24 hr tablet    Sig: Take 1 tablet (  50 mg total) by mouth daily. Take with or immediately following a meal.    Dispense:  90 tablet    Refill:  3   sildenafil (VIAGRA) 100 MG tablet    Sig: Take 0.5-1 tablets (50-100 mg total) by mouth daily as needed for erectile dysfunction.    Dispense:  5 tablet    Refill:  11   triamterene-hydrochlorothiazide (MAXZIDE-25) 37.5-25 MG tablet    Sig: Take 1 tablet by mouth daily.    Dispense:  90 tablet    Refill:  3   Orders Placed This Encounter  Procedures   Fecal occult blood, imunochemical    Standing Status:   Future    Standing Expiration Date:   08/05/2023     Patient instructions: Pass by lab to pick up stool kit.  Retry atorvastatin 22m daily.  Good to see you today Return as needed or in 1 year for next physical.   Follow up plan: Return in about 1 year (around 08/05/2023) for annual exam, prior fasting for blood work.  JRia Bush  MD

## 2022-08-04 NOTE — Assessment & Plan Note (Signed)
Chronic, stable on current regimen - continue. 

## 2022-08-04 NOTE — Assessment & Plan Note (Signed)
Preventative protocols reviewed and updated unless pt declined. Discussed healthy diet and lifestyle.  

## 2022-08-05 ENCOUNTER — Encounter: Payer: BC Managed Care – PPO | Admitting: Family Medicine

## 2023-03-14 IMAGING — US US SCROTUM
1 series · 14 of 25 positions shown · non-contrast
Comparison: None.

CLINICAL DATA: Right scrotal swelling.  Evaluate hydrocele.

EXAM:
SCROTAL ULTRASOUND
DOPPLER ULTRASOUND OF THE TESTICLES
TECHNIQUE: Complete ultrasound examination of the testicles, epididymis, and
other scrotal structures was performed. Color and spectral Doppler
ultrasound were also utilized to evaluate blood flow to the
testicles.

[Series 1: us scrotum · 0.06mm/px · 14 of 76 slices shown]
[im 1/76]
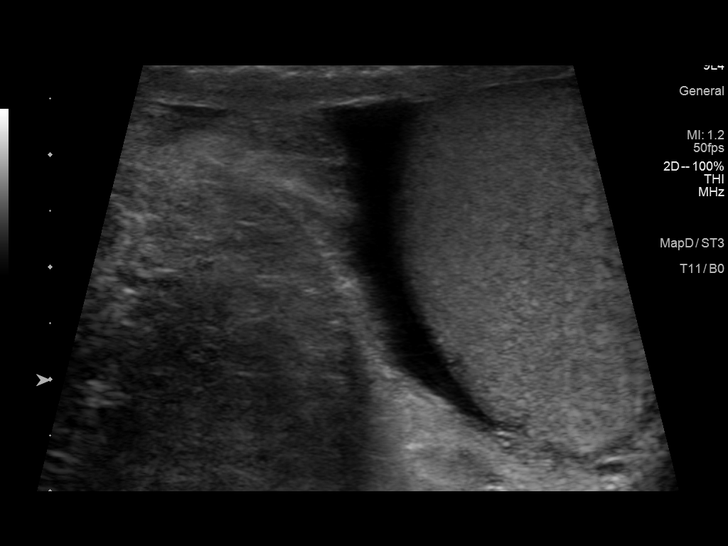
[im 7/76]
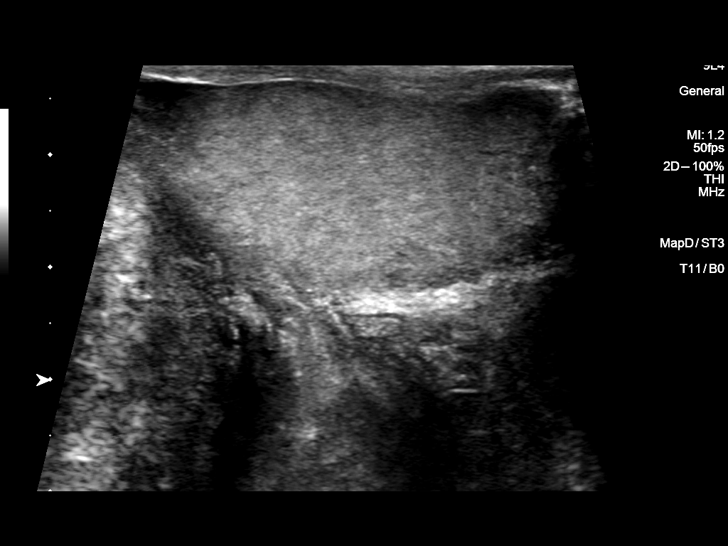
[im 13/76]
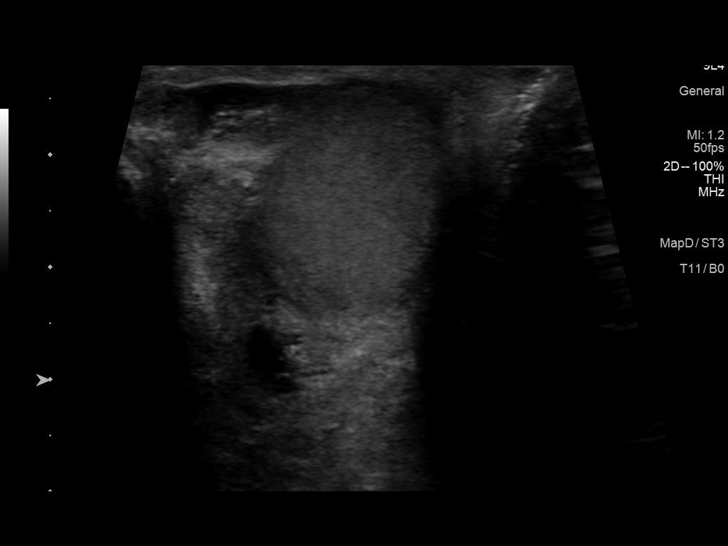
[im 19/76]
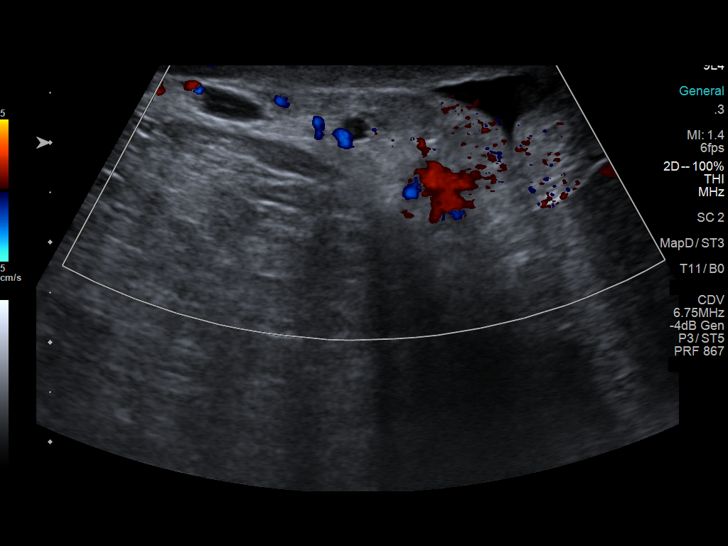
[im 26/76]
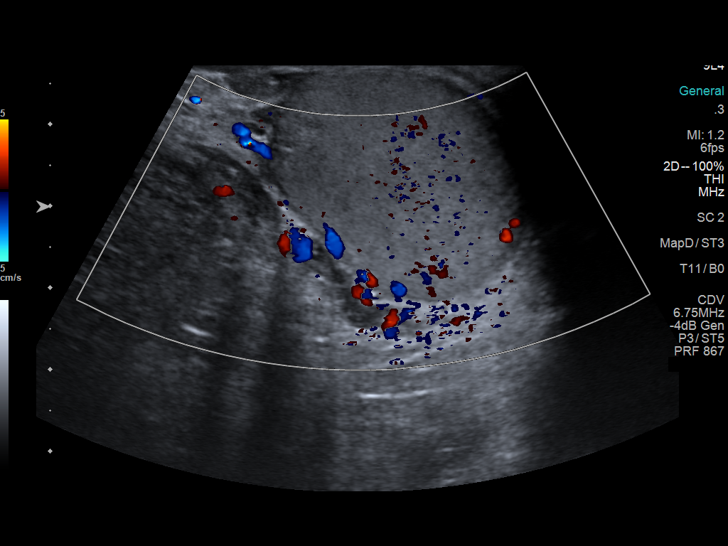
[im 29/76]
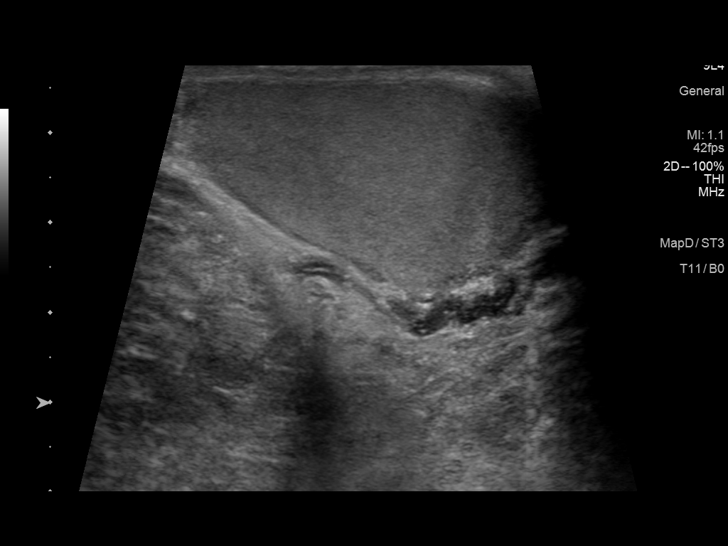
[im 35/76]
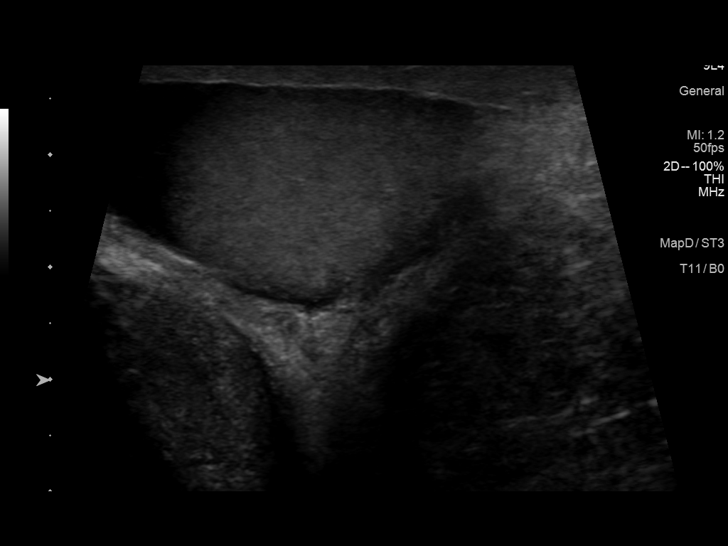
[im 41/76]
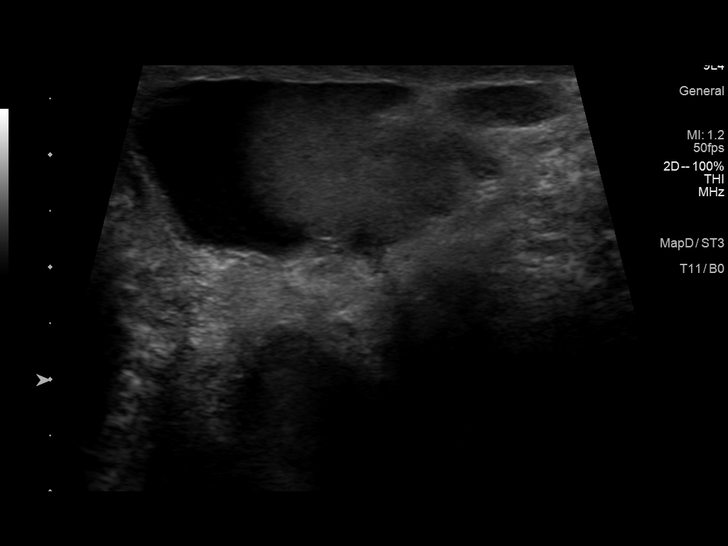
[im 47/76]
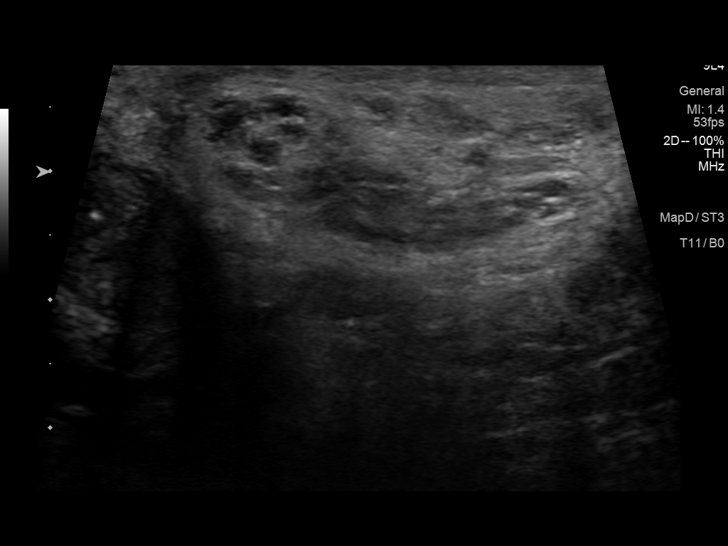
[im 51/76]
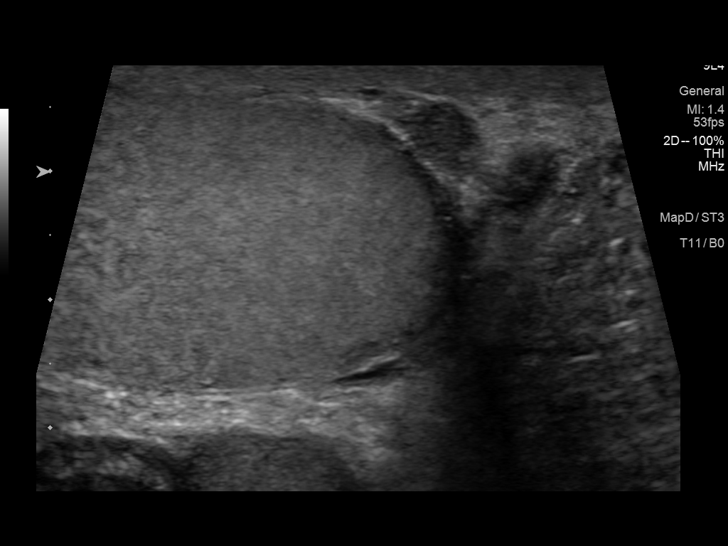
[im 57/76]
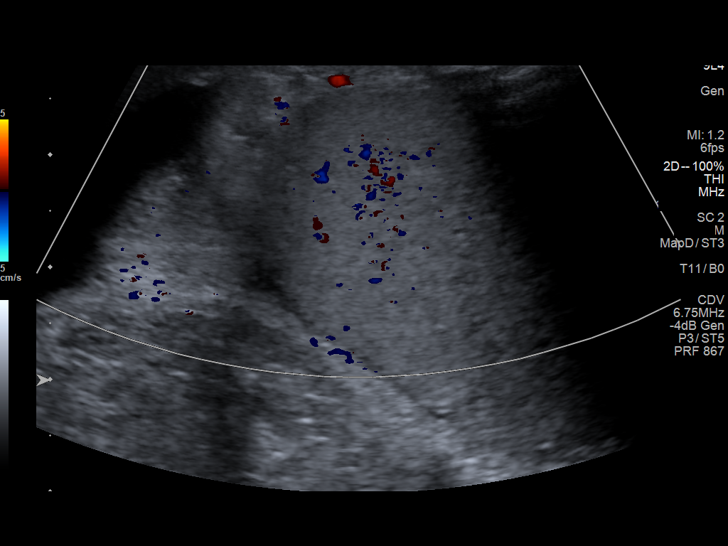
[im 63/76]
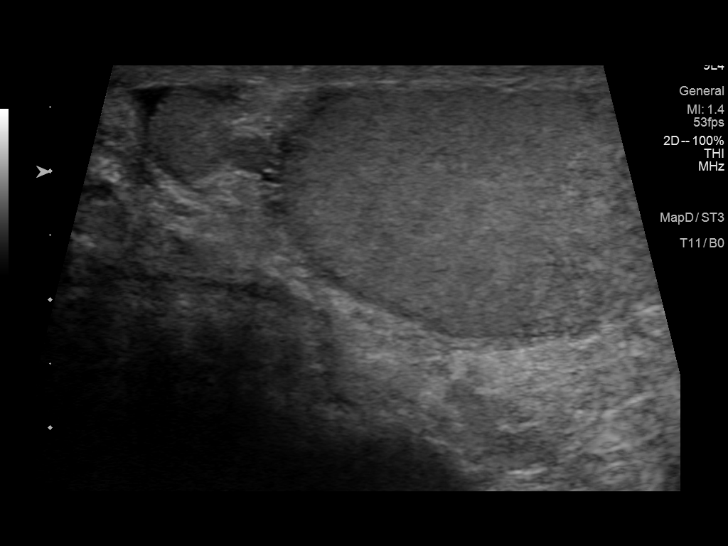
[im 69/76]
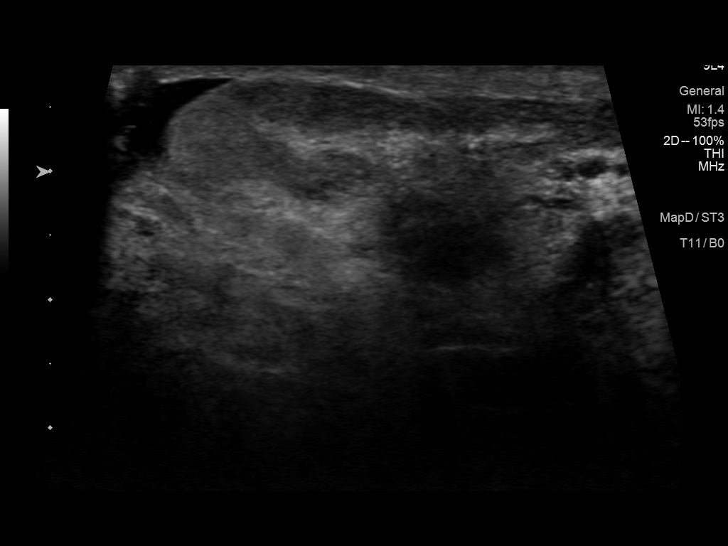
[im 76/76]
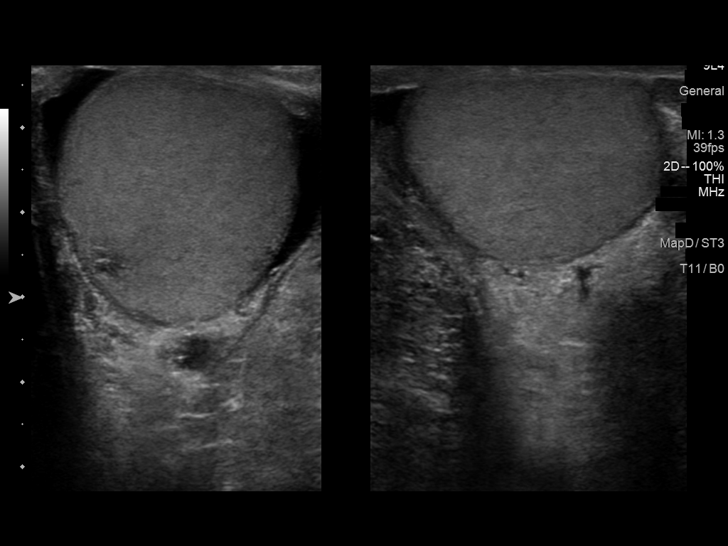

[14 of 25 positions shown; findings below may reference images not displayed]

FINDINGS: Right testicle

Measurements: 4.3 x 2.3 x 3.3 cm. Normal sonographic appearance. No
mass lesion. Normal blood flow.

Left testicle

Measurements: 4.2 x 2.4 x 3.2 cm. Normal sonographic appearance. No
mass lesion. Normal blood flow.

Right epididymis:  7 mm epididymal cyst.  Otherwise normal.

Left epididymis:  4 mm epididymal cyst.  Otherwise normal.

Hydrocele: Small right hydrocele, so small that it would be
questionably noticeable.

Varicocele:  None visualized.

Pulsed Doppler interrogation of both testes demonstrates normal low
resistance arterial and venous waveforms bilaterally.
IMPRESSION: Very small hydrocele on the right, probably subclinical. Tiny
epididymal cysts, not significant.

## 2023-06-25 ENCOUNTER — Other Ambulatory Visit: Payer: Self-pay | Admitting: Family Medicine

## 2023-06-25 DIAGNOSIS — Z1211 Encounter for screening for malignant neoplasm of colon: Secondary | ICD-10-CM

## 2023-06-25 DIAGNOSIS — Z1212 Encounter for screening for malignant neoplasm of rectum: Secondary | ICD-10-CM

## 2023-07-31 ENCOUNTER — Other Ambulatory Visit: Payer: Self-pay | Admitting: Family Medicine

## 2023-07-31 DIAGNOSIS — Z125 Encounter for screening for malignant neoplasm of prostate: Secondary | ICD-10-CM

## 2023-07-31 DIAGNOSIS — E785 Hyperlipidemia, unspecified: Secondary | ICD-10-CM

## 2023-08-03 ENCOUNTER — Other Ambulatory Visit (INDEPENDENT_AMBULATORY_CARE_PROVIDER_SITE_OTHER): Payer: Self-pay

## 2023-08-03 DIAGNOSIS — E785 Hyperlipidemia, unspecified: Secondary | ICD-10-CM

## 2023-08-03 DIAGNOSIS — Z125 Encounter for screening for malignant neoplasm of prostate: Secondary | ICD-10-CM

## 2023-08-03 LAB — LIPID PANEL
Cholesterol: 147 mg/dL (ref 0–200)
HDL: 30.5 mg/dL — ABNORMAL LOW (ref >39.00)
LDL Cholesterol: 84 mg/dL (ref 0–99)
NonHDL: 116.59
Total CHOL/HDL Ratio: 5
Triglycerides: 163 mg/dL — ABNORMAL HIGH (ref 0.0–149.0)
VLDL: 32.6 mg/dL (ref 0.0–40.0)

## 2023-08-03 LAB — COMPREHENSIVE METABOLIC PANEL
ALT: 22 U/L (ref 0–53)
AST: 20 U/L (ref 0–37)
Albumin: 4.3 g/dL (ref 3.5–5.2)
Alkaline Phosphatase: 99 U/L (ref 39–117)
BUN: 15 mg/dL (ref 6–23)
CO2: 30 meq/L (ref 19–32)
Calcium: 9.3 mg/dL (ref 8.4–10.5)
Chloride: 102 meq/L (ref 96–112)
Creatinine, Ser: 0.92 mg/dL (ref 0.40–1.50)
GFR: 87.2 mL/min (ref >60.00)
Glucose, Bld: 98 mg/dL (ref 70–99)
Potassium: 3.4 meq/L — ABNORMAL LOW (ref 3.5–5.1)
Sodium: 144 meq/L (ref 135–145)
Total Bilirubin: 0.5 mg/dL (ref 0.2–1.2)
Total Protein: 7.1 g/dL (ref 6.0–8.3)

## 2023-08-03 LAB — PSA: PSA: 1.8 ng/mL (ref 0.10–4.00)

## 2023-08-03 LAB — TSH: TSH: 1.75 u[IU]/mL (ref 0.35–5.50)

## 2023-08-10 ENCOUNTER — Ambulatory Visit: Payer: Self-pay | Admitting: Family Medicine

## 2023-08-10 ENCOUNTER — Encounter: Payer: Self-pay | Admitting: Family Medicine

## 2023-08-10 VITALS — BP 160/90 | HR 60 | Temp 97.8°F | Ht 70.0 in | Wt 208.0 lb

## 2023-08-10 DIAGNOSIS — Z0000 Encounter for general adult medical examination without abnormal findings: Secondary | ICD-10-CM | POA: Insufficient documentation

## 2023-08-10 DIAGNOSIS — E785 Hyperlipidemia, unspecified: Secondary | ICD-10-CM

## 2023-08-10 DIAGNOSIS — Z96643 Presence of artificial hip joint, bilateral: Secondary | ICD-10-CM | POA: Diagnosis not present

## 2023-08-10 DIAGNOSIS — N529 Male erectile dysfunction, unspecified: Secondary | ICD-10-CM | POA: Insufficient documentation

## 2023-08-10 DIAGNOSIS — I1A0 Resistant hypertension: Secondary | ICD-10-CM

## 2023-08-10 DIAGNOSIS — Z7189 Other specified counseling: Secondary | ICD-10-CM | POA: Diagnosis not present

## 2023-08-10 MED ORDER — METOPROLOL SUCCINATE ER 50 MG PO TB24: 50.0000 mg | ORAL_TABLET | Freq: Every day | ORAL | 4 refills | Status: DC

## 2023-08-10 MED ORDER — SILDENAFIL CITRATE 100 MG PO TABS: 50.0000 mg | ORAL_TABLET | Freq: Every day | ORAL | 12 refills | Status: DC | PRN

## 2023-08-10 MED ORDER — AMLODIPINE BESYLATE 10 MG PO TABS: 10.0000 mg | ORAL_TABLET | Freq: Every day | ORAL | 4 refills | Status: DC

## 2023-08-10 MED ORDER — TRIAMTERENE-HCTZ 37.5-25 MG PO TABS: 1.0000 | ORAL_TABLET | Freq: Every day | ORAL | 4 refills | Status: DC

## 2023-08-10 MED ORDER — ENALAPRIL MALEATE 20 MG PO TABS: 20.0000 mg | ORAL_TABLET | Freq: Two times a day (BID) | ORAL | 4 refills | Status: DC

## 2023-08-10 MED ORDER — ATORVASTATIN CALCIUM 20 MG PO TABS: 20.0000 mg | ORAL_TABLET | Freq: Every day | ORAL | 4 refills | Status: DC

## 2023-08-10 NOTE — Progress Notes (Signed)
Ph: [phone] Fax: [phone]   Patient ID: Roy Galvan, male    DOB: 19-Dec-1955, 67 y.o.   MRN: [id number]  This visit was conducted in person.  BP (!) 160/90 (BP Location: Right Arm, Cuff Size: Large)   Pulse 60   Temp 97.8 F (36.6 C) (Oral)   Ht 5\' 10"  (1.778 m)   Wt 208 lb (94.3 kg)   SpO2 98%   BMI 29.84 kg/m    CC: welcome to medicare visit Subjective:   HPI: Roy Galvan is a 67 y.o. male presenting on 08/10/2023 for Welcome to Medicare Exam   Hearing Screening   500Hz  1000Hz  2000Hz  4000Hz   Right ear 20 25 20 25   Left ear 25 40 20 0  Comments: Says wife has told him he can't hear well.  Vision Screening   Right eye Left eye Both eyes  Without correction 20/25 20/25 20/25   With correction       Flowsheet Row Office Visit from 08/04/2022 in [facility] [place]  PHQ-2 Total Score 0          02/19/2014    8:44 AM  Fall Risk   Falls in the past year? No   HTN - continues amlodipine 10mg  daily, enalapril 20mg  BID, Toprol XL 50mg  daily and maxzide 37.5/25mg  daily. BP elevated today - he just took meds 1 hour ago.   No fmhx AAA.    Preventative: COLONOSCOPY Date: 2011 WNL, rec rpt 10 yrs (Dr. Bluford Kaufmann).  Due for rpt testing - cologuard pending  Prostate cancer screening - father with h/o prostate cancer treated with seeds. Continues yearly screen.  Lung cancer screening - not eligible  Flu shot yearly - declines  COVID vaccine - discussed, declines. Tetanus 2011, Tdap 04/2019  Pneumovax - not due  Shingrix - discussed, declines  Advanced directive - does not have this. He thinks wife would be HCPOA. Packet provided today.  Seat belt use discussed  Sunscreen use discussed. No changing moles on skin. 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 Interim medical history since our last visit reviewed. Allergies and medications reviewed and updated. Outpatient Medications Prior to Visit  Medication Sig Dispense Refill   acetaminophen (TYLENOL) 500 MG tablet Take 1 tablet (500 mg total) by mouth in the morning and at bedtime.     aspirin 81 MG tablet Take 81 mg by mouth daily.     amLODipine (NORVASC) 10 MG tablet Take 1 tablet (10 mg total) by mouth daily. 90 tablet 3   atorvastatin (LIPITOR) 20 MG tablet Take 1 tablet (20 mg total) by mouth daily. 90 tablet 3   enalapril (VASOTEC) 20 MG tablet Take 1 tablet (20 mg total) by mouth 2 (two) times daily. 180 tablet 3   metoprolol succinate (TOPROL-XL) 50 MG 24 hr tablet Take 1 tablet (50 mg total) by mouth daily. Take with or immediately following a meal. 90 tablet 3  sildenafil (VIAGRA) 100 MG tablet Take 0.5-1 tablets (50-100 mg total) by mouth daily as needed for erectile dysfunction. 5 tablet 11   triamterene-hydrochlorothiazide (MAXZIDE-25) 37.5-25 MG tablet Take 1 tablet by mouth daily. 90 tablet 3   No facility-administered medications prior to visit.     Per HPI unless specifically indicated in ROS section below Review of Systems  Objective:  BP (!) 160/90 (BP Location: Right Arm, Cuff Size: Large)   Pulse 60   Temp 97.8 F (36.6 C) (Oral)   Ht 5\' 10"  (1.778 m)   Wt 208 lb (94.3 kg)   SpO2 98%   BMI 29.84 kg/m   Wt Readings from Last 3 Encounters:  08/10/23 208 lb (94.3 kg)  08/04/22 201 lb (91.2 kg)  08/04/21 203 lb 7 oz (92.3 kg)      Physical Exam Vitals and nursing note reviewed.  Constitutional:      General: He is not in acute distress.    Appearance: Normal appearance. He is  well-developed. He is not ill-appearing.  HENT:     Head: Normocephalic and atraumatic.     Right Ear: Hearing, tympanic membrane, ear canal and external ear normal.     Left Ear: Hearing, tympanic membrane, ear canal and external ear normal.     Mouth/Throat:     Mouth: Mucous membranes are moist.     Pharynx: Oropharynx is clear. No oropharyngeal exudate or posterior oropharyngeal erythema.  Eyes:     General: No scleral icterus.    Extraocular Movements: Extraocular movements intact.     Conjunctiva/sclera: Conjunctivae normal.     Pupils: Pupils are equal, round, and reactive to light.  Neck:     Thyroid: No thyroid mass or thyromegaly.     Vascular: No carotid bruit.  Cardiovascular:     Rate and Rhythm: Normal rate and regular rhythm.     Pulses: Normal pulses.          Radial pulses are 2+ on the right side and 2+ on the left side.     Heart sounds: Normal heart sounds. No murmur heard. Pulmonary:     Effort: Pulmonary effort is normal. No respiratory distress.     Breath sounds: Normal breath sounds. No wheezing, rhonchi or rales.  Abdominal:     General: Bowel sounds are normal. There is no distension.     Palpations: Abdomen is soft. There is no mass.     Tenderness: There is no abdominal tenderness. There is no guarding or rebound.     Hernia: No hernia is present.     Comments: No abdominal bruit  Musculoskeletal:        General: Normal range of motion.     Cervical back: Normal range of motion and neck supple.     Right lower leg: No edema.     Left lower leg: No edema.  Lymphadenopathy:     Cervical: No cervical adenopathy.  Skin:    General: Skin is warm and dry.     Findings: No rash.  Neurological:     General: No focal deficit present.     Mental Status: He is alert and oriented to person, place, and time.     Comments:  Recall 3/3  Calculation 3/5 DROLW  Psychiatric:        Mood and Affect: Mood normal.        Behavior: Behavior normal.         Thought Content: Thought content normal.  Judgment: Judgment normal.       Results for orders placed or performed in visit on 08/03/23  TSH  Result Value Ref Range   TSH 1.75 0.35 - 5.50 uIU/mL  PSA  Result Value Ref Range   PSA 1.80 0.10 - 4.00 ng/mL  Comprehensive metabolic panel  Result Value Ref Range   Sodium 144 135 - 145 mEq/L   Potassium 3.4 (L) 3.5 - 5.1 mEq/L   Chloride 102 96 - 112 mEq/L   CO2 30 19 - 32 mEq/L   Glucose, Bld 98 70 - 99 mg/dL   BUN 15 6 - 23 mg/dL   Creatinine, Ser 5.36 0.40 - 1.50 mg/dL   Total Bilirubin 0.5 0.2 - 1.2 mg/dL   Alkaline Phosphatase 99 39 - 117 U/L   AST 20 0 - 37 U/L   ALT 22 0 - 53 U/L   Total Protein 7.1 6.0 - 8.3 g/dL   Albumin 4.3 3.5 - 5.2 g/dL   GFR 64.40 >34.74 mL/min   Calcium 9.3 8.4 - 10.5 mg/dL  Lipid panel  Result Value Ref Range   Cholesterol 147 0 - 200 mg/dL   Triglycerides 259.5 (H) 0.0 - 149.0 mg/dL   HDL 63.87 (L) >56.43 mg/dL   VLDL 32.9 0.0 - 51.8 mg/dL   LDL Cholesterol 84 0 - 99 mg/dL   Total CHOL/HDL Ratio 5    NonHDL 116.59    EKG - sinus bradycardia rate 50s, normal axis, intervals, no hypertrophy or acute ST/T changes, prolonged PR with first degree AV block   Assessment & Plan:   Problem List Items Addressed This Visit     Welcome to Medicare preventive visit - Primary (Chronic)    I have personally reviewed the Medicare Annual Wellness questionnaire and have noted 1. The patient's medical and social history 2. Their use of alcohol, tobacco or illicit drugs 3. Their current medications and supplements 4. The patient's functional ability including ADL's, fall risks, home safety risks and hearing or visual impairment. Cognitive function has been assessed and addressed as indicated.  5. Diet and physical activity 6. Evidence for depression or mood disorders The patients weight, height, BMI have been recorded in the chart. I have made referrals, counseling and provided education to the  patient based on review of the above and I have provided the pt with a written personalized care plan for preventive services. Provider list updated.. See scanned questionairre as needed for further documentation. Reviewed preventative protocols and updated unless pt declined.       Relevant Orders   EKG 12-Lead (Completed)   Advanced directives, counseling/discussion (Chronic)    Advanced directive - does not have this. He thinks wife would be HCPOA. Packet provided today.       Resistant hypertension    Chronic, BP remaining elevated despite 4 drug regimen - BP log sheet provided for him to start monitoring, and drop off in 2 wks to review home readings, will further titrate antihypertensives accordingly.       Relevant Medications   amLODipine (NORVASC) 10 MG tablet   atorvastatin (LIPITOR) 20 MG tablet   enalapril (VASOTEC) 20 MG tablet   metoprolol succinate (TOPROL-XL) 50 MG 24 hr tablet   sildenafil (VIAGRA) 100 MG tablet   triamterene-hydrochlorothiazide (MAXZIDE-25) 37.5-25 MG tablet   Dyslipidemia    Chronic, improved on lovastatin and tolerating well - continue. The 10-year ASCVD risk score (Arnett DK, et al., 2019) is: 22.7%   Values used to calculate the  score:     Age: 6 years     Sex: Male     Is Non-Hispanic African American: No     Diabetic: No     Tobacco smoker: No     Systolic Blood Pressure: 160 mmHg     Is BP treated: Yes     HDL Cholesterol: 30.5 mg/dL     Total Cholesterol: 147 mg/dL       Relevant Medications   atorvastatin (LIPITOR) 20 MG tablet   S/P hip replacement, bilateral   Erectile dysfunction    Refill viagra Rx      Relevant Medications   sildenafil (VIAGRA) 100 MG tablet     Meds ordered this encounter  Medications   amLODipine (NORVASC) 10 MG tablet    Sig: Take 1 tablet (10 mg total) by mouth daily.    Dispense:  90 tablet    Refill:  4   atorvastatin (LIPITOR) 20 MG tablet    Sig: Take 1 tablet (20 mg total) by mouth  daily.    Dispense:  90 tablet    Refill:  4   enalapril (VASOTEC) 20 MG tablet    Sig: Take 1 tablet (20 mg total) by mouth 2 (two) times daily.    Dispense:  180 tablet    Refill:  4   metoprolol succinate (TOPROL-XL) 50 MG 24 hr tablet    Sig: Take 1 tablet (50 mg total) by mouth daily. Take with or immediately following a meal.    Dispense:  90 tablet    Refill:  4   sildenafil (VIAGRA) 100 MG tablet    Sig: Take 0.5-1 tablets (50-100 mg total) by mouth daily as needed for erectile dysfunction.    Dispense:  5 tablet    Refill:  12   triamterene-hydrochlorothiazide (MAXZIDE-25) 37.5-25 MG tablet    Sig: Take 1 tablet by mouth daily.    Dispense:  90 tablet    Refill:  4    Orders Placed This Encounter  Procedures   EKG 12-Lead    Patient Instructions  Consider pneumonia shot.  Consider scheduling eye exam.  BP was too high today - BP log sheet provided to monitor at home over the next few weeks and drop off readings to review. If consistently >140/90 we may further change BP medicines.  Return in 1 year for next wellness visit/physical.  If BP staying high, schedule sooner follow up visit.   Follow up plan: Return in about 1 year (around 08/09/2024) for medicare wellness visit, annual exam, prior fasting for blood work.  Eustaquio Boyden, MD

## 2023-08-10 NOTE — Assessment & Plan Note (Signed)

## 2023-08-10 NOTE — Patient Instructions (Addendum)
Consider pneumonia shot.  Consider scheduling eye exam.  BP was too high today - BP log sheet provided to monitor at home over the next few weeks and drop off readings to review. If consistently >140/90 we may further change BP medicines.  Return in 1 year for next wellness visit/physical.  If BP staying high, schedule sooner follow up visit.

## 2023-08-10 NOTE — Assessment & Plan Note (Addendum)
Chronic, BP remaining elevated despite 4 drug regimen - BP log sheet provided for him to start monitoring, and drop off in 2 wks to review home readings, will further titrate antihypertensives accordingly.

## 2023-08-10 NOTE — Assessment & Plan Note (Signed)
Advanced directive - does not have this. He thinks wife would be HCPOA. Packet provided today.

## 2023-08-10 NOTE — Assessment & Plan Note (Signed)
Chronic, improved on lovastatin and tolerating well - continue. The 10-year ASCVD risk score (Arnett DK, et al., 2019) is: 22.7%   Values used to calculate the score:     Age: 67 years     Sex: Male     Is Non-Hispanic African American: No     Diabetic: No     Tobacco smoker: No     Systolic Blood Pressure: 160 mmHg     Is BP treated: Yes     HDL Cholesterol: 30.5 mg/dL     Total Cholesterol: 147 mg/dL

## 2023-08-10 NOTE — Assessment & Plan Note (Signed)
Refill viagra Rx

## 2023-11-30 ENCOUNTER — Telehealth: Payer: Self-pay

## 2023-11-30 DIAGNOSIS — I1A0 Resistant hypertension: Secondary | ICD-10-CM

## 2023-11-30 NOTE — Telephone Encounter (Signed)
 Copied from CRM [id number]. Topic: Clinical - Prescription Issue >> Nov 30, 2023 11:01 AM Eunice Blase wrote: Reason for CRM: Pt called stated he had been keeping track of blood pressure but it still running high and stated doctor wanted to change medication. Please call pt at [phone].

## 2023-11-30 NOTE — Telephone Encounter (Signed)
 Spoke pt asking for recent BP readings. States he did not record readings, just checked daily for awhile. Says readings average 140s-150s/90s.

## 2023-12-09 MED ORDER — TRIAMTERENE-HCTZ 37.5-25 MG PO TABS: 2.0000 | ORAL_TABLET | Freq: Every day | ORAL | 3 refills | Status: DC

## 2023-12-09 NOTE — Telephone Encounter (Addendum)
 Ensure regular with 4 BP meds: amlodipine 10mg  daily, enalapril 20mg  BID, Toprol XL 50mg  daily and maxzide 37.5/25mg  daily   BP Readings from Last 3 Encounters:  08/10/23 (!) 160/90  08/04/22 138/82  08/04/21 136/86    Pulse Readings from Last 3 Encounters:  08/10/23 60  08/04/22 62  08/04/21 62   H/o intolerance to spironolactone.  Recommend increase maxzide to 2 tablets daily may take both at same time.  New instructions sent to pharmacy. Rec schedule f/u labs in 1 week - ordered.

## 2023-12-09 NOTE — Telephone Encounter (Signed)
 Spoke with pt asking if he's taking all 4 BP meds. Pt confirms he is. I relayed Dr Timoteo Expose message. Pt verbalizes understanding. States he will pick med today to start tomorrow. Scheduled f/u lab visit on 12/21/23 at 8:15. Fyi to Dr Reece Agar.

## 2023-12-09 NOTE — Addendum Note (Signed)
 Addended by: Eustaquio Boyden on: 12/09/2023 12:30 PM   Modules accepted: Orders

## 2023-12-21 ENCOUNTER — Other Ambulatory Visit (INDEPENDENT_AMBULATORY_CARE_PROVIDER_SITE_OTHER): Payer: Self-pay

## 2023-12-21 DIAGNOSIS — I1A0 Resistant hypertension: Secondary | ICD-10-CM | POA: Diagnosis not present

## 2023-12-21 LAB — BASIC METABOLIC PANEL WITH GFR
BUN: 22 mg/dL (ref 6–23)
CO2: 30 meq/L (ref 19–32)
Calcium: 9.8 mg/dL (ref 8.4–10.5)
Chloride: 101 meq/L (ref 96–112)
Creatinine, Ser: 1.12 mg/dL (ref 0.40–1.50)
GFR: 68.68 mL/min (ref >60.00)
Glucose, Bld: 110 mg/dL — ABNORMAL HIGH (ref 70–99)
Potassium: 3.4 meq/L — ABNORMAL LOW (ref 3.5–5.1)
Sodium: 141 meq/L (ref 135–145)

## 2024-02-29 DIAGNOSIS — D492 Neoplasm of unspecified behavior of bone, soft tissue, and skin: Secondary | ICD-10-CM | POA: Diagnosis not present

## 2024-02-29 DIAGNOSIS — C4442 Squamous cell carcinoma of skin of scalp and neck: Secondary | ICD-10-CM | POA: Diagnosis not present

## 2024-02-29 DIAGNOSIS — L570 Actinic keratosis: Secondary | ICD-10-CM | POA: Diagnosis not present

## 2024-04-25 DIAGNOSIS — C4442 Squamous cell carcinoma of skin of scalp and neck: Secondary | ICD-10-CM | POA: Diagnosis not present

## 2024-06-13 DIAGNOSIS — L570 Actinic keratosis: Secondary | ICD-10-CM | POA: Diagnosis not present

## 2024-06-13 DIAGNOSIS — Z08 Encounter for follow-up examination after completed treatment for malignant neoplasm: Secondary | ICD-10-CM | POA: Diagnosis not present

## 2024-06-13 DIAGNOSIS — L814 Other melanin hyperpigmentation: Secondary | ICD-10-CM | POA: Diagnosis not present

## 2024-06-13 DIAGNOSIS — Z85828 Personal history of other malignant neoplasm of skin: Secondary | ICD-10-CM | POA: Diagnosis not present

## 2024-06-13 DIAGNOSIS — L821 Other seborrheic keratosis: Secondary | ICD-10-CM | POA: Diagnosis not present

## 2024-08-05 ENCOUNTER — Other Ambulatory Visit: Payer: Self-pay | Admitting: Family Medicine

## 2024-08-05 DIAGNOSIS — E785 Hyperlipidemia, unspecified: Secondary | ICD-10-CM

## 2024-08-05 DIAGNOSIS — Z125 Encounter for screening for malignant neoplasm of prostate: Secondary | ICD-10-CM

## 2024-08-05 DIAGNOSIS — I1A0 Resistant hypertension: Secondary | ICD-10-CM

## 2024-08-05 NOTE — Addendum Note (Signed)
 Addended by: RILLA BALLER on: 08/05/2024 12:03 PM   Modules accepted: Orders

## 2024-08-05 NOTE — Progress Notes (Signed)
 Per up to date: Medications that increase aldosterone include diuretics (eg, spironolactone ), beta blockers, clonidine, alpha-methyldopa, and nonsteroidal antiinflammatory agents (NSAIDs). -Medications that decrease aldosterone include calcium  channel blockers, angiotensin-converting enzyme inhibitors, direct renin inhibitors, and angiotensin receptor blockers.

## 2024-08-08 ENCOUNTER — Other Ambulatory Visit (INDEPENDENT_AMBULATORY_CARE_PROVIDER_SITE_OTHER): Payer: Medicare HMO

## 2024-08-08 DIAGNOSIS — Z125 Encounter for screening for malignant neoplasm of prostate: Secondary | ICD-10-CM | POA: Diagnosis not present

## 2024-08-08 DIAGNOSIS — E785 Hyperlipidemia, unspecified: Secondary | ICD-10-CM | POA: Diagnosis not present

## 2024-08-08 DIAGNOSIS — I1A0 Resistant hypertension: Secondary | ICD-10-CM

## 2024-08-08 LAB — COMPREHENSIVE METABOLIC PANEL WITH GFR
ALT: 19 U/L (ref 0–53)
AST: 17 U/L (ref 0–37)
Albumin: 4.2 g/dL (ref 3.5–5.2)
Alkaline Phosphatase: 104 U/L (ref 39–117)
BUN: 17 mg/dL (ref 6–23)
CO2: 30 meq/L (ref 19–32)
Calcium: 9.1 mg/dL (ref 8.4–10.5)
Chloride: 102 meq/L (ref 96–112)
Creatinine, Ser: 1.05 mg/dL (ref 0.40–1.50)
GFR: 73.88 mL/min (ref >60.00)
Glucose, Bld: 105 mg/dL — ABNORMAL HIGH (ref 70–99)
Potassium: 3.6 meq/L (ref 3.5–5.1)
Sodium: 143 meq/L (ref 135–145)
Total Bilirubin: 0.9 mg/dL (ref 0.2–1.2)
Total Protein: 6.9 g/dL (ref 6.0–8.3)

## 2024-08-08 LAB — URINALYSIS, ROUTINE W REFLEX MICROSCOPIC
Bilirubin Urine: NEGATIVE
Hgb urine dipstick: NEGATIVE
Ketones, ur: NEGATIVE
Leukocytes,Ua: NEGATIVE
Nitrite: NEGATIVE
RBC / HPF: NONE SEEN (ref 0–?)
Specific Gravity, Urine: 1.01 (ref 1.000–1.030)
Total Protein, Urine: NEGATIVE
Urine Glucose: NEGATIVE
Urobilinogen, UA: 0.2 (ref 0.0–1.0)
pH: 7 (ref 5.0–8.0)

## 2024-08-08 LAB — LIPID PANEL
Cholesterol: 137 mg/dL (ref 0–200)
HDL: 30.9 mg/dL — ABNORMAL LOW (ref >39.00)
LDL Cholesterol: 75 mg/dL (ref 0–99)
NonHDL: 105.89
Total CHOL/HDL Ratio: 4
Triglycerides: 156 mg/dL — ABNORMAL HIGH (ref 0.0–149.0)
VLDL: 31.2 mg/dL (ref 0.0–40.0)

## 2024-08-08 LAB — PSA: PSA: 1.95 ng/mL (ref 0.10–4.00)

## 2024-08-08 LAB — MICROALBUMIN / CREATININE URINE RATIO
Creatinine,U: 52.1 mg/dL
Microalb Creat Ratio: 25.4 mg/g (ref 0.0–30.0)
Microalb, Ur: 1.3 mg/dL (ref 0.0–1.9)

## 2024-08-08 LAB — TSH: TSH: 2.28 u[IU]/mL (ref 0.35–5.50)

## 2024-08-09 ENCOUNTER — Other Ambulatory Visit (HOSPITAL_COMMUNITY): Payer: Self-pay

## 2024-08-14 ENCOUNTER — Ambulatory Visit: Payer: Self-pay | Admitting: Family Medicine

## 2024-08-15 ENCOUNTER — Other Ambulatory Visit: Payer: Self-pay

## 2024-08-15 ENCOUNTER — Encounter: Payer: Self-pay | Admitting: Family Medicine

## 2024-08-15 ENCOUNTER — Ambulatory Visit: Payer: Medicare HMO | Admitting: Family Medicine

## 2024-08-15 ENCOUNTER — Other Ambulatory Visit (HOSPITAL_COMMUNITY): Payer: Self-pay

## 2024-08-15 VITALS — BP 168/92 | HR 60 | Temp 98.0°F | Ht 69.5 in | Wt 210.5 lb

## 2024-08-15 DIAGNOSIS — Z1211 Encounter for screening for malignant neoplasm of colon: Secondary | ICD-10-CM

## 2024-08-15 DIAGNOSIS — Z7189 Other specified counseling: Secondary | ICD-10-CM

## 2024-08-15 DIAGNOSIS — E785 Hyperlipidemia, unspecified: Secondary | ICD-10-CM | POA: Diagnosis not present

## 2024-08-15 DIAGNOSIS — Z0000 Encounter for general adult medical examination without abnormal findings: Secondary | ICD-10-CM | POA: Diagnosis not present

## 2024-08-15 DIAGNOSIS — I1A0 Resistant hypertension: Secondary | ICD-10-CM

## 2024-08-15 MED ORDER — ENALAPRIL MALEATE 20 MG PO TABS
20.0000 mg | ORAL_TABLET | Freq: Two times a day (BID) | ORAL | 3 refills | Status: AC
  Filled 2024-08-15: qty 180, 90d supply, fill #0

## 2024-08-15 MED ORDER — METOPROLOL SUCCINATE ER 50 MG PO TB24
50.0000 mg | ORAL_TABLET | Freq: Every day | ORAL | 3 refills | Status: AC
  Filled 2024-08-15: qty 90, 90d supply, fill #0

## 2024-08-15 MED ORDER — SPIRONOLACTONE 25 MG PO TABS
25.0000 mg | ORAL_TABLET | Freq: Every day | ORAL | 3 refills | Status: DC
  Filled 2024-08-15: qty 90, 90d supply, fill #0

## 2024-08-15 MED ORDER — AMLODIPINE BESYLATE 10 MG PO TABS
10.0000 mg | ORAL_TABLET | Freq: Every day | ORAL | 3 refills | Status: AC
  Filled 2024-08-15: qty 90, 90d supply, fill #0

## 2024-08-15 MED ORDER — ATORVASTATIN CALCIUM 20 MG PO TABS
20.0000 mg | ORAL_TABLET | Freq: Every day | ORAL | 3 refills | Status: AC
  Filled 2024-08-15: qty 90, 90d supply, fill #0

## 2024-08-15 NOTE — Patient Instructions (Addendum)
 Cologuard ordered again - complete and return  Consider pneumonia shot  Work on advanced directive.  Schedule eye exam.  Stop triamcinolone/hydrochlorothiazide   (Maxzide). Start in its place spironolactone  25mg  daily. Schedule lab visit on Monday to monitor kidney function and potassium levels on new medicine  Return in 1 month for blood pressure follow up visit

## 2024-08-15 NOTE — Assessment & Plan Note (Signed)
 Previously packet provided. Encouraged he work on this at home.

## 2024-08-15 NOTE — Assessment & Plan Note (Signed)
 Preventative protocols reviewed and updated unless pt declined. Discussed healthy diet and lifestyle.

## 2024-08-15 NOTE — Assessment & Plan Note (Signed)
 Chronic, BP remains above goal despite 5 drug regimen  Aldosterone levels pending. If normal, consider renal artery ultrasound eval.  Stop maxzide, start spironolactone  in its place 25mg  daily. Reviewed previously intolerance (sexual dysfunction) - he agrees to retry this.  Rec BMP 5d after starting spironolactone  RTC 1 mo HTN f/u visit

## 2024-08-15 NOTE — Progress Notes (Signed)
 Ph: [phone] Fax: [phone]   Patient ID: Roy Galvan, male    DOB: [birth year], 68 y.o.   MRN: [id number]  This visit was conducted in person.  BP (!) 168/92 (BP Location: Right Arm, Cuff Size: Large)   Pulse 60   Temp 98 F (36.7 C) (Oral)   Ht 5' 9.5 (1.765 m)   Wt 210 lb 8 oz (95.5 kg)   SpO2 97%   BMI 30.64 kg/m   BP Readings from Last 3 Encounters:  08/15/24 (!) 168/92  08/10/23 (!) 160/90  08/04/22 138/82   CC: CPE/AMW Subjective:   HPI: Roy Galvan is a 68 y.o. male presenting on 08/15/2024 for Annual Exam   Did not see health advisor this year  Continues working.  Son had a kidney transplant.   No results found.  Flowsheet Row Office Visit from 08/15/2024 in [facility] [place]  PHQ-2 Total Score 0       08/15/2024   10:28 AM 02/19/2014    8:44 AM  Fall Risk   Falls in the past year? 0 No   Number falls in past yr: 0   Injury with Fall? 0   Risk for fall due to : No Fall Risks   Follow up Falls evaluation completed      Data saved with a previous flowsheet row definition   HTN - on amlodipine  10mg  daily, enalapril  20mg  bid, toprol  XL 50mg  daily, maxzide 37.5/25mg  2 tablets daily. He only took 2 tablets daily until Rx ran out then he returned to once daily maxzide. BP staying elevated. No HA, vision changes, CP/tightness, SOB, leg swelling.   Stopped coffee a few months ago with worsening constipation - he's started taking stool softener daily with benefit.   Preventative: COLONOSCOPY Date: 2011 WNL, rec rpt 10 yrs (Dr. Ora). Due for rpt testing - cologuard never completed . Requests reorder today  Prostate cancer screening - father with h/o prostate cancer treated with seeds. Continue yearly screen.  Lung cancer screening - not eligible  Flu shot yearly - declined COVID vaccine - declined. Tetanus 2011, Tdap 04/2019  Prevnar-20 - discussed, declines Shingrix - declined  Advanced directive - does not have  this. He thinks wife would be HCPOA. Packet provided last year.  Seat belt use discussed  Sunscreen use discussed. No changing moles on skin. Saw derm last year [place].  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 Interim medical history since our last visit reviewed. Allergies and medications reviewed and updated. Outpatient Medications Prior to Visit  Medication Sig Dispense Refill   acetaminophen  (TYLENOL ) 500 MG tablet Take 1 tablet (500 mg total) by mouth in the morning and at bedtime.     aspirin  81 MG tablet Take 81 mg by mouth daily.     amLODipine  (NORVASC ) 10 MG tablet Take 1 tablet (10 mg total) by mouth daily. 90 tablet  4   atorvastatin  (LIPITOR) 20 MG tablet Take 1 tablet (20 mg total) by mouth daily. 90 tablet 4   enalapril  (VASOTEC ) 20 MG tablet Take 1 tablet (20 mg total) by mouth 2 (two) times daily. 180 tablet 4   metoprolol  succinate (TOPROL -XL) 50 MG 24 hr tablet Take 1 tablet (50 mg total) by mouth daily. Take with or immediately following a meal. 90 tablet 4   sildenafil  (VIAGRA ) 100 MG tablet Take 0.5-1 tablets (50-100 mg total) by mouth daily as needed for erectile dysfunction. 5 tablet 12   triamterene -hydrochlorothiazide  (MAXZIDE-25) 37.5-25 MG tablet Take 2 tablets by mouth daily. 180 tablet 3   No facility-administered medications prior to visit.     Per HPI unless specifically indicated in ROS section below Review of Systems  Constitutional:  Negative for  activity change, appetite change, chills, fatigue, fever and unexpected weight change.  HENT:  Negative for hearing loss.   Eyes:  Negative for visual disturbance.  Respiratory:  Positive for cough (occ). Negative for chest tightness, shortness of breath and wheezing.   Cardiovascular:  Negative for chest pain, palpitations and leg swelling.  Gastrointestinal:  Positive for abdominal pain (occ constipation related) and constipation. Negative for abdominal distention, blood in stool, diarrhea, nausea and vomiting.  Genitourinary:  Negative for difficulty urinating and hematuria.  Musculoskeletal:  Negative for arthralgias, myalgias and neck pain.  Skin:  Negative for rash.  Neurological:  Negative for dizziness, seizures, syncope and headaches.  Hematological:  Negative for adenopathy. Does not bruise/bleed easily.  Psychiatric/Behavioral:  Negative for dysphoric mood. The patient is not nervous/anxious.     Objective:  BP (!) 168/92 (BP Location: Right Arm, Cuff Size: Large)   Pulse 60   Temp 98 F (36.7 C) (Oral)   Ht 5' 9.5 (1.765 m)   Wt 210 lb 8 oz (95.5 kg)   SpO2 97%   BMI 30.64 kg/m   Wt Readings from Last 3 Encounters:  08/15/24 210 lb 8 oz (95.5 kg)  08/10/23 208 lb (94.3 kg)  08/04/22 201 lb (91.2 kg)      Physical Exam Vitals and nursing note reviewed.  Constitutional:      General: He is not in acute distress.    Appearance: Normal appearance. He is well-developed. He is not ill-appearing.  HENT:     Head: Normocephalic and atraumatic.     Right Ear: Hearing, tympanic membrane, ear canal and external ear normal.     Left Ear: Hearing, tympanic membrane, ear canal and external ear normal.     Mouth/Throat:     Mouth: Mucous membranes are moist.     Pharynx: Oropharynx is clear. No oropharyngeal exudate or posterior oropharyngeal erythema.  Eyes:     General: No scleral icterus.    Extraocular Movements: Extraocular movements intact.     Conjunctiva/sclera:  Conjunctivae normal.     Pupils: Pupils are equal, round, and reactive to light.  Neck:     Thyroid : No thyroid  mass or thyromegaly.     Vascular: No carotid bruit.  Cardiovascular:     Rate and Rhythm: Normal rate and regular rhythm.     Pulses: Normal pulses.          Radial pulses are 2+ on the right side and 2+ on the left side.     Heart sounds: Normal heart sounds. No murmur heard. Pulmonary:     Effort: Pulmonary effort is normal. No respiratory distress.     Breath sounds: Normal breath  sounds. No wheezing, rhonchi or rales.  Abdominal:     General: Bowel sounds are normal. There is no distension.     Palpations: Abdomen is soft. There is no mass.     Tenderness: There is no abdominal tenderness. There is no guarding or rebound.     Hernia: No hernia is present.  Musculoskeletal:        General: Normal range of motion.     Cervical back: Normal range of motion and neck supple.     Right lower leg: No edema.     Left lower leg: No edema.  Lymphadenopathy:     Cervical: No cervical adenopathy.  Skin:    General: Skin is warm and dry.     Findings: No rash.  Neurological:     General: No focal deficit present.     Mental Status: He is alert and oriented to person, place, and time.     Comments:  Recall 3/3 Calculation 5/5 DLROW   Psychiatric:        Mood and Affect: Mood normal.        Behavior: Behavior normal.        Thought Content: Thought content normal.        Judgment: Judgment normal.       Results for orders placed or performed in visit on 08/08/24  Urinalysis, Routine w reflex microscopic   Collection Time: 08/08/24  7:29 AM  Result Value Ref Range   Color, Urine YELLOW Yellow;Lt. Yellow;Straw;Dark Yellow;Amber;Green;Red;Brown   APPearance CLEAR Clear;Turbid;Slightly Cloudy;Cloudy   Specific Gravity, Urine 1.010 1.000 - 1.030   pH 7.0 5.0 - 8.0   Total Protein, Urine NEGATIVE Negative   Urine Glucose NEGATIVE Negative   Ketones, ur NEGATIVE Negative    Bilirubin Urine NEGATIVE Negative   Hgb urine dipstick NEGATIVE Negative   Urobilinogen, UA 0.2 0.0 - 1.0   Leukocytes,Ua NEGATIVE Negative   Nitrite NEGATIVE Negative   WBC, UA 0-2/hpf 0-2/hpf   RBC / HPF none seen 0-2/hpf  Microalbumin / creatinine urine ratio   Collection Time: 08/08/24  7:29 AM  Result Value Ref Range   Microalb, Ur 1.3 0.0 - 1.9 mg/dL   Creatinine,U 47.8 mg/dL   Microalb Creat Ratio 25.4 0.0 - 30.0 mg/g  PSA   Collection Time: 08/08/24  7:29 AM  Result Value Ref Range   PSA 1.95 0.10 - 4.00 ng/mL  Comprehensive metabolic panel with GFR   Collection Time: 08/08/24  7:29 AM  Result Value Ref Range   Sodium 143 135 - 145 mEq/L   Potassium 3.6 3.5 - 5.1 mEq/L   Chloride 102 96 - 112 mEq/L   CO2 30 19 - 32 mEq/L   Glucose, Bld 105 (H) 70 - 99 mg/dL   BUN 17 6 - 23 mg/dL   Creatinine, Ser 8.94 0.40 - 1.50 mg/dL   Total Bilirubin 0.9 0.2 - 1.2 mg/dL   Alkaline Phosphatase 104 39 - 117 U/L   AST 17 0 - 37 U/L   ALT 19 0 - 53 U/L   Total Protein 6.9 6.0 - 8.3 g/dL   Albumin 4.2 3.5 - 5.2 g/dL   GFR 26.11 >39.99 mL/min   Calcium  9.1 8.4 - 10.5 mg/dL  Lipid panel   Collection Time: 08/08/24  7:29 AM  Result Value Ref Range   Cholesterol 137 0 - 200 mg/dL   Triglycerides 843.9 (H) 0.0 - 149.0 mg/dL   HDL 69.09 (L) >60.99 mg/dL   VLDL 68.7 0.0 -  40.0 mg/dL   LDL Cholesterol 75 0 - 99 mg/dL   Total CHOL/HDL Ratio 4    NonHDL 105.89   TSH   Collection Time: 08/08/24  7:29 AM  Result Value Ref Range   TSH 2.28 0.35 - 5.50 uIU/mL    Assessment & Plan:   Problem List Items Addressed This Visit     Healthcare maintenance (Chronic)   Preventative protocols reviewed and updated unless pt declined. Discussed healthy diet and lifestyle.       Medicare annual wellness visit, initial - Primary (Chronic)   I have personally reviewed the Medicare Annual Wellness questionnaire and have noted 1. The patient's medical and social history 2. Their use of  alcohol, tobacco or illicit drugs 3. Their current medications and supplements 4. The patient's functional ability including ADL's, fall risks, home safety risks and hearing or visual impairment. Cognitive function has been assessed and addressed as indicated.  5. Diet and physical activity 6. Evidence for depression or mood disorders The patients weight, height, BMI have been recorded in the chart. I have made referrals, counseling and provided education to the patient based on review of the above and I have provided the pt with a written personalized care plan for preventive services. Provider list updated.. See scanned questionairre as needed for further documentation. Reviewed preventative protocols and updated unless pt declined.       Advanced directives, counseling/discussion (Chronic)   Previously packet provided. Encouraged he work on this at home.       Resistant hypertension   Chronic, BP remains above goal despite 5 drug regimen  Aldosterone levels pending. If normal, consider renal artery ultrasound eval.  Stop maxzide, start spironolactone  in its place 25mg  daily. Reviewed previously intolerance (sexual dysfunction) - he agrees to retry this.  Rec BMP 5d after starting spironolactone  RTC 1 mo HTN f/u visit       Relevant Medications   amLODipine  (NORVASC ) 10 MG tablet   atorvastatin  (LIPITOR) 20 MG tablet   enalapril  (VASOTEC ) 20 MG tablet   metoprolol  succinate (TOPROL -XL) 50 MG 24 hr tablet   spironolactone  (ALDACTONE ) 25 MG tablet   Other Relevant Orders   Basic Metabolic Panel   Dyslipidemia   Chronic, stable on atorvastatin  - continue. The 10-year ASCVD risk score (Arnett DK, et al., 2019) is: 24.9%   Values used to calculate the score:     Age: 56 years     Clincally relevant sex: Male     Is Non-Hispanic African American: No     Diabetic: No     Tobacco smoker: No     Systolic Blood Pressure: 168 mmHg     Is BP treated: Yes     HDL Cholesterol: 30.9  mg/dL     Total Cholesterol: 137 mg/dL       Relevant Medications   atorvastatin  (LIPITOR) 20 MG tablet   Other Visit Diagnoses       Special screening for malignant neoplasms, colon       Relevant Orders   Cologuard        Meds ordered this encounter  Medications   amLODipine  (NORVASC ) 10 MG tablet    Sig: Take 1 tablet (10 mg total) by mouth daily.    Dispense:  90 tablet    Refill:  3   atorvastatin  (LIPITOR) 20 MG tablet    Sig: Take 1 tablet (20 mg total) by mouth daily.    Dispense:  90 tablet    Refill:  3  enalapril  (VASOTEC ) 20 MG tablet    Sig: Take 1 tablet (20 mg total) by mouth 2 (two) times daily.    Dispense:  180 tablet    Refill:  3   metoprolol  succinate (TOPROL -XL) 50 MG 24 hr tablet    Sig: Take 1 tablet (50 mg total) by mouth daily. Take with or immediately following a meal.    Dispense:  90 tablet    Refill:  3   spironolactone  (ALDACTONE ) 25 MG tablet    Sig: Take 1 tablet (25 mg total) by mouth daily.    Dispense:  90 tablet    Refill:  3    To replace maxzide    Orders Placed This Encounter  Procedures   Cologuard   Basic Metabolic Panel    Standing Status:   Future    Expiration Date:   08/15/2025    Patient Instructions  Cologuard ordered again - complete and return  Consider pneumonia shot  Work on advanced directive.  Schedule eye exam.  Stop triamcinolone/hydrochlorothiazide   (Maxzide). Start in its place spironolactone  25mg  daily. Schedule lab visit on Monday to monitor kidney function and potassium levels on new medicine  Return in 1 month for blood pressure follow up visit   Follow up plan: Return in about 1 month (around 09/14/2024) for follow up visit.  Anton Blas, MD

## 2024-08-15 NOTE — Assessment & Plan Note (Signed)

## 2024-08-15 NOTE — Assessment & Plan Note (Signed)
 Chronic, stable on atorvastatin  - continue. The 10-year ASCVD risk score (Arnett DK, et al., 2019) is: 24.9%   Values used to calculate the score:     Age: 68 years     Clincally relevant sex: Male     Is Non-Hispanic African American: No     Diabetic: No     Tobacco smoker: No     Systolic Blood Pressure: 168 mmHg     Is BP treated: Yes     HDL Cholesterol: 30.9 mg/dL     Total Cholesterol: 137 mg/dL

## 2024-08-16 ENCOUNTER — Other Ambulatory Visit (HOSPITAL_COMMUNITY): Payer: Self-pay

## 2024-08-16 ENCOUNTER — Other Ambulatory Visit: Payer: Self-pay

## 2024-08-20 LAB — ALDOSTERONE + RENIN ACTIVITY W/ RATIO
ALDO / PRA Ratio: 35.3 ratio — ABNORMAL HIGH (ref 0.9–28.9)
Aldosterone: 6 ng/dL
Renin Activity: 0.17 ng/mL/h — ABNORMAL LOW (ref 0.25–5.82)

## 2024-08-22 ENCOUNTER — Other Ambulatory Visit (INDEPENDENT_AMBULATORY_CARE_PROVIDER_SITE_OTHER)

## 2024-08-22 DIAGNOSIS — I1A0 Resistant hypertension: Secondary | ICD-10-CM | POA: Diagnosis not present

## 2024-08-22 LAB — BASIC METABOLIC PANEL WITH GFR
BUN: 14 mg/dL (ref 6–23)
CO2: 33 meq/L — ABNORMAL HIGH (ref 19–32)
Calcium: 8.9 mg/dL (ref 8.4–10.5)
Chloride: 103 meq/L (ref 96–112)
Creatinine, Ser: 0.94 mg/dL (ref 0.40–1.50)
GFR: 84.35 mL/min (ref >60.00)
Glucose, Bld: 102 mg/dL — ABNORMAL HIGH (ref 70–99)
Potassium: 3.6 meq/L (ref 3.5–5.1)
Sodium: 142 meq/L (ref 135–145)

## 2024-08-24 ENCOUNTER — Ambulatory Visit: Payer: Self-pay | Admitting: Family Medicine

## 2024-08-25 NOTE — Telephone Encounter (Signed)
 Copied from CRM [id number]. Topic: General - Call Back - No Documentation >> Aug 25, 2024 12:21 PM Alfonso ORN wrote: Reason for CRM: Pt called to return call. Relayed message from pcp . Pt states bp doing alittle better with medication.

## 2024-09-18 ENCOUNTER — Telehealth: Payer: Self-pay

## 2024-09-18 NOTE — Telephone Encounter (Signed)
 I spoke with pt; pt feeling little better today; last night BP 141/91; have not taken BP this morning. Pt already has appt for 09/19/24 at 8 AM and pt plans to keep that appt. Pt said has not had any H/A or CP or SOB. Pt said no lightheadedness since 09/17/24. UC & ED precautions given and pt voiced understanding. Sending note to Dr KANDICE and G pool.

## 2024-09-18 NOTE — Telephone Encounter (Signed)
 [name]

## 2024-09-19 ENCOUNTER — Other Ambulatory Visit: Payer: Self-pay

## 2024-09-19 ENCOUNTER — Encounter: Payer: Self-pay | Admitting: Family Medicine

## 2024-09-19 ENCOUNTER — Ambulatory Visit (INDEPENDENT_AMBULATORY_CARE_PROVIDER_SITE_OTHER): Admitting: Family Medicine

## 2024-09-19 ENCOUNTER — Other Ambulatory Visit (HOSPITAL_COMMUNITY): Payer: Self-pay

## 2024-09-19 VITALS — BP 154/92 | HR 60 | Temp 97.9°F | Ht 70.0 in | Wt 213.4 lb

## 2024-09-19 DIAGNOSIS — I1A0 Resistant hypertension: Secondary | ICD-10-CM

## 2024-09-19 DIAGNOSIS — R7989 Other specified abnormal findings of blood chemistry: Secondary | ICD-10-CM | POA: Insufficient documentation

## 2024-09-19 MED ORDER — SPIRONOLACTONE 25 MG PO TABS
25.0000 mg | ORAL_TABLET | Freq: Two times a day (BID) | ORAL | 2 refills | Status: AC
  Filled 2024-09-19 – 2024-10-18 (×3): qty 180, 90d supply, fill #0

## 2024-09-19 NOTE — Progress Notes (Signed)
 " Ph: [phone] Fax: [phone]   Patient ID: Roy Galvan, male    DOB: Nov 27, 1955, 68 y.o.   MRN: [id number]  This visit was conducted in person.  BP (!) 154/92 (BP Location: Right Arm, Cuff Size: Large)   Pulse 60   Temp 97.9 F (36.6 C) (Oral)   Ht 5' 10 (1.778 m)   Wt 213 lb 6.4 oz (96.8 kg)   SpO2 95%   BMI 30.62 kg/m   BP Readings from Last 3 Encounters:  09/19/24 (!) 154/92  08/15/24 (!) 168/92  08/10/23 (!) 160/90    CC: check blood pressure  Subjective:   HPI: Roy Galvan is a 68 y.o. male presenting on 09/19/2024 for Medical Management of Chronic Issues (Pt states bp has been running high at home and making him feel wuzzy)   HTN - Compliant with current antihypertensive regimen of continues amlodipine  10mg  daily, enalapril  20mg  bid, Toprol  XL 50mg  daily, and recent addition of spironolactone  25mg  to replace maxzide.  Does check blood pressures at home: 149/88 this morning. 2d ago did experience dizziness woozy feeling - bp was high at that time 168/98. No low blood pressure readings, no orthostatic dizziness. Denies HA, vision changes, CP/tightness, SOB, leg swelling.   He already limits salt in diet  He knows he needs to lose weight.   Restorative sleep. No significant daytime somnolence. + snoring. No witnessed apnea or PNDyspnea. No morning headaches.   Recent dental work - needs 2 teeth extractions.      Relevant past medical, surgical, family and social history reviewed and updated as indicated. Interim medical history since our last visit reviewed. Allergies and medications reviewed and updated. Outpatient Medications Prior to Visit  Medication Sig Dispense Refill   acetaminophen  (TYLENOL ) 500 MG tablet Take 1 tablet (500 mg total) by mouth in the morning and at bedtime. (Patient taking differently: Take 500 mg by mouth as needed.)     amLODipine  (NORVASC ) 10 MG tablet Take 1 tablet (10 mg total) by mouth daily. 90 tablet 3   aspirin  81 MG  tablet Take 81 mg by mouth daily.     atorvastatin  (LIPITOR) 20 MG tablet Take 1 tablet (20 mg total) by mouth daily. 90 tablet 3   enalapril  (VASOTEC ) 20 MG tablet Take 1 tablet (20 mg total) by mouth 2 (two) times daily. 180 tablet 3   metoprolol  succinate (TOPROL -XL) 50 MG 24 hr tablet Take 1 tablet (50 mg total) by mouth daily. Take with or immediately following a meal. 90 tablet 3   spironolactone  (ALDACTONE ) 25 MG tablet Take 1 tablet (25 mg total) by mouth daily. 90 tablet 3   No facility-administered medications prior to visit.     Per HPI unless specifically indicated in ROS section below Review of Systems  Objective:  BP (!) 154/92 (BP Location: Right Arm, Cuff Size: Large)   Pulse 60   Temp 97.9 F (36.6 C) (Oral)   Ht 5' 10 (1.778 m)   Wt 213 lb 6.4 oz (96.8 kg)   SpO2 95%   BMI 30.62 kg/m   Wt Readings from Last 3 Encounters:  09/19/24 213 lb 6.4 oz (96.8 kg)  08/15/24 210 lb 8 oz (95.5 kg)  08/10/23 208 lb (94.3 kg)      Physical Exam Vitals and nursing note reviewed.  Constitutional:      Appearance: Normal appearance. He is not ill-appearing.  HENT:     Head: Normocephalic and atraumatic.  Mouth/Throat:     Mouth: Mucous membranes are moist.     Pharynx: Oropharynx is clear. No oropharyngeal exudate or posterior oropharyngeal erythema.  Eyes:     Extraocular Movements: Extraocular movements intact.     Pupils: Pupils are equal, round, and reactive to light.  Cardiovascular:     Rate and Rhythm: Normal rate and regular rhythm.     Pulses: Normal pulses.     Heart sounds: Normal heart sounds. No murmur heard. Pulmonary:     Effort: Pulmonary effort is normal. No respiratory distress.     Breath sounds: Normal breath sounds. No wheezing, rhonchi or rales.  Musculoskeletal:     Cervical back: Normal range of motion and neck supple.     Right lower leg: No edema.     Left lower leg: No edema.  Skin:    General: Skin is warm and dry.     Findings: No  rash.  Neurological:     Mental Status: He is alert.  Psychiatric:        Mood and Affect: Mood normal.        Behavior: Behavior normal.       Results for orders placed or performed in visit on 08/22/24  Basic Metabolic Panel   Collection Time: 08/22/24  8:57 AM  Result Value Ref Range   Sodium 142 135 - 145 mEq/L   Potassium 3.6 3.5 - 5.1 mEq/L   Chloride 103 96 - 112 mEq/L   CO2 33 (H) 19 - 32 mEq/L   Glucose, Bld 102 (H) 70 - 99 mg/dL   BUN 14 6 - 23 mg/dL   Creatinine, Ser 9.05 0.40 - 1.50 mg/dL   GFR 15.64 >39.99 mL/min   Calcium  8.9 8.4 - 10.5 mg/dL   88/81/7974: Aldosterone 6 8 R, CM  Comment: . Unable to flag abnormal result(s), please refer     to reference range(s) below: . Adult Reference Ranges for Aldosterone, LC/MS/MS:     Upright  8:00 - 10:00 am    < or = 28 ng/dL     Upright  5:99 -  3:99 pm    < or = 21 ng/dL     Supine   1:99 - 89:99 am       3 - 16 ng/dL .  Renin Activity 0.17 Low    ALDO / PRA Ratio 35.3 High  26.7   Assessment & Plan:   Problem List Items Addressed This Visit     Resistant hypertension - Primary   Chronic, BP improving but remaining high despite 4 drug regimen including recent addition of spironolactone  25mg  daily - will increase spironolactone  to 25mg  bid.  He has tolerated spironolactone  well so far.  RTC Friday for BMP.  Recent aldosterone levels 6 however low renin and elevated aldo/renin levels suggesting possible hyperaldosteronism - will order CT scan to evaluate adrenal glands.  RTC 1 mo HTN f/u visit       Relevant Medications   spironolactone  (ALDACTONE ) 25 MG tablet   Other Relevant Orders   Basic Metabolic Panel   CT ABDOMEN W WO CONTRAST   Abnormal aldosterone to renin ratio   Check adrenal CT       Relevant Orders   CT ABDOMEN W WO CONTRAST     Meds ordered this encounter  Medications   spironolactone  (ALDACTONE ) 25 MG tablet    Sig: Take 1 tablet (25 mg total) by mouth 2 (two) times daily.     Dispense:  180 tablet    Refill:  2    Note new dose    Orders Placed This Encounter  Procedures   CT ABDOMEN W WO CONTRAST    Adrenal protocol CT    Standing Status:   Future    Expiration Date:   09/19/2025    If indicated for the ordered procedure, I authorize the administration of contrast media per Radiology protocol:   Yes    Does the patient have a contrast media/X-ray dye allergy?:   No    Preferred imaging location?:   [facility]    If indicated for the ordered procedure, I authorize the administration of oral contrast media per Radiology protocol:   Yes   Basic Metabolic Panel    Standing Status:   Future    Expiration Date:   09/19/2025    Patient Instructions  BP is better but staying elevated - increase spironolactone  to 25mg  twice daily I'd like to check CT scan of kidneys and adrenal glands to evaluate for other causes of high blood pressure. Call centralized scheduling at [facility] to schedule appointment: [phone]   Schedule lab visit on Friday for potassium recheck.  Return in 1 month for BP follow up visit   Follow up plan: No follow-ups on file.  Anton Blas, MD   "

## 2024-09-19 NOTE — Assessment & Plan Note (Addendum)
 Chronic, BP improving but remaining high despite 4 drug regimen including recent addition of spironolactone  25mg  daily - will increase spironolactone  to 25mg  bid.  He has tolerated spironolactone  well so far.  RTC Friday for BMP.  Recent aldosterone levels 6 however low renin and elevated aldo/renin levels suggesting possible hyperaldosteronism - will order CT scan to evaluate adrenal glands.  RTC 1 mo HTN f/u visit

## 2024-09-19 NOTE — Patient Instructions (Addendum)
 BP is better but staying elevated - increase spironolactone  to 25mg  twice daily I'd like to check CT scan of kidneys and adrenal glands to evaluate for other causes of high blood pressure. Call centralized scheduling at [facility] to schedule appointment: [phone]   Schedule lab visit on Friday for potassium recheck.  Return in 1 month for BP follow up visit

## 2024-09-19 NOTE — Assessment & Plan Note (Signed)
 Check adrenal CT

## 2024-09-22 ENCOUNTER — Other Ambulatory Visit (INDEPENDENT_AMBULATORY_CARE_PROVIDER_SITE_OTHER)

## 2024-09-22 DIAGNOSIS — I1A0 Resistant hypertension: Secondary | ICD-10-CM

## 2024-09-22 LAB — BASIC METABOLIC PANEL WITH GFR
BUN: 18 mg/dL (ref 6–23)
CO2: 27 meq/L (ref 19–32)
Calcium: 8.7 mg/dL (ref 8.4–10.5)
Chloride: 105 meq/L (ref 96–112)
Creatinine, Ser: 1.03 mg/dL (ref 0.40–1.50)
GFR: 75.54 mL/min
Glucose, Bld: 111 mg/dL — ABNORMAL HIGH (ref 70–99)
Potassium: 4 meq/L (ref 3.5–5.1)
Sodium: 142 meq/L (ref 135–145)

## 2024-09-25 ENCOUNTER — Ambulatory Visit: Payer: Self-pay | Admitting: Family Medicine

## 2024-09-25 NOTE — Telephone Encounter (Signed)
 Called patient reviewed all information and repeated back to me. Will call if any questions.  ? ?

## 2024-10-18 ENCOUNTER — Other Ambulatory Visit (HOSPITAL_COMMUNITY): Payer: Self-pay
